# Patient Record
Sex: Male | Born: 1950
Health system: Southern US, Community
[De-identification: ages and names within clinical notes are randomized; demographics above are authoritative.]

## PROBLEM LIST (undated history)

## (undated) DIAGNOSIS — N189 Chronic kidney disease, unspecified: Secondary | ICD-10-CM

## (undated) DIAGNOSIS — Z8619 Personal history of other infectious and parasitic diseases: Secondary | ICD-10-CM

## (undated) DIAGNOSIS — E119 Type 2 diabetes mellitus without complications: Secondary | ICD-10-CM

## (undated) DIAGNOSIS — I10 Essential (primary) hypertension: Secondary | ICD-10-CM

## (undated) HISTORY — DX: Type 2 diabetes mellitus without complications: E11.9

## (undated) HISTORY — DX: Personal history of other infectious and parasitic diseases: Z86.19

---

## 1988-04-01 HISTORY — PX: KIDNEY STONE SURGERY: SHX686

## 2011-04-17 ENCOUNTER — Encounter (HOSPITAL_COMMUNITY): Payer: Self-pay | Admitting: Pharmacy Technician

## 2011-04-19 ENCOUNTER — Other Ambulatory Visit: Payer: Self-pay

## 2011-04-19 ENCOUNTER — Ambulatory Visit (HOSPITAL_COMMUNITY)
Admission: RE | Admit: 2011-04-19 | Discharge: 2011-04-19 | Disposition: A | Discharge status: Home / Self Care | Payer: 59 | Source: Ambulatory Visit | Attending: Anesthesiology | Admitting: Anesthesiology

## 2011-04-19 ENCOUNTER — Encounter (HOSPITAL_COMMUNITY)
Admission: RE | Admit: 2011-04-19 | Discharge: 2011-04-19 | Disposition: A | Discharge status: Home / Self Care | Payer: 59 | Source: Ambulatory Visit | Attending: Neurosurgery | Admitting: Neurosurgery

## 2011-04-19 ENCOUNTER — Encounter (HOSPITAL_COMMUNITY): Payer: Self-pay

## 2011-04-19 DIAGNOSIS — Z01818 Encounter for other preprocedural examination: Secondary | ICD-10-CM | POA: Insufficient documentation

## 2011-04-19 DIAGNOSIS — M4120 Other idiopathic scoliosis, site unspecified: Secondary | ICD-10-CM | POA: Insufficient documentation

## 2011-04-19 DIAGNOSIS — Z01810 Encounter for preprocedural cardiovascular examination: Secondary | ICD-10-CM | POA: Insufficient documentation

## 2011-04-19 DIAGNOSIS — Z01812 Encounter for preprocedural laboratory examination: Secondary | ICD-10-CM | POA: Insufficient documentation

## 2011-04-19 HISTORY — DX: Essential (primary) hypertension: I10

## 2011-04-19 HISTORY — DX: Chronic kidney disease, unspecified: N18.9

## 2011-04-19 LAB — CBC
Hemoglobin: 15.8 g/dL (ref 13.0–17.0)
Platelets: 235 10*3/uL (ref 150–400)
RBC: 5.32 MIL/uL (ref 4.22–5.81)
WBC: 7.4 10*3/uL (ref 4.0–10.5)

## 2011-04-19 LAB — BASIC METABOLIC PANEL
CO2: 26 mEq/L (ref 19–32)
Calcium: 9.7 mg/dL (ref 8.4–10.5)
GFR calc non Af Amer: >90 mL/min (ref >90)
Potassium: 4.3 mEq/L (ref 3.5–5.1)
Sodium: 140 mEq/L (ref 135–145)

## 2011-04-19 LAB — TYPE AND SCREEN: ABO/RH(D): O POS

## 2011-04-19 LAB — SURGICAL PCR SCREEN: Staphylococcus aureus: NEGATIVE

## 2011-04-19 LAB — ABO/RH: ABO/RH(D): O POS

## 2011-04-19 NOTE — Pre-Procedure Instructions (Signed)
20 CANTRELL LAROUCHE  04/19/2011   Your procedure is scheduled on:  Apr 26, 2011 (Friday)  Report to [facility] at 0530 AM.  Call this number if you have problems the morning of surgery: [phone]   Remember:   Do not eat food:After Midnight.  May have clear liquids: up to 4 Hours before arrival.  Clear liquids include soda, tea, black coffee, apple or grape juice, broth.  Take these medicines the morning of surgery with A SIP OF WATER: None   Do not wear jewelry, make-up or nail polish.  Do not wear lotions, powders, or perfumes. You may wear deodorant.  Do not shave 48 hours prior to surgery.  Do not bring valuables to the hospital.  Contacts, dentures or bridgework may not be worn into surgery.  Leave suitcase in the car. After surgery it may be brought to your room.  For patients admitted to the hospital, checkout time is 11:00 AM the day of discharge.   Patients discharged the day of surgery will not be allowed to drive home.  Name and phone number of your driver: NA  Special Instructions: CHG Shower Use Special Wash: 1/2 bottle night before surgery and 1/2 bottle morning of surgery.   Please read over the following fact sheets that you were given: Pain Booklet, Blood Transfusion Information and Surgical Site Infection Prevention

## 2011-04-25 MED ORDER — DEXAMETHASONE SODIUM PHOSPHATE 10 MG/ML IJ SOLN
10.0000 mg | Freq: Once | INTRAMUSCULAR | Status: AC
  Administered 2011-04-26: 10 mg via INTRAVENOUS
  Filled 2011-04-25: qty 1

## 2011-04-25 MED ORDER — SODIUM CHLORIDE 0.9 % IV SOLN
500.0000 mg | INTRAVENOUS | Status: AC
  Administered 2011-04-26: 1000 mg via INTRAVENOUS
  Filled 2011-04-25: qty 500

## 2011-04-26 ENCOUNTER — Encounter (HOSPITAL_COMMUNITY): Payer: Self-pay | Admitting: *Deleted

## 2011-04-26 ENCOUNTER — Encounter (HOSPITAL_COMMUNITY): Payer: Self-pay | Admitting: Anesthesiology

## 2011-04-26 ENCOUNTER — Ambulatory Visit (HOSPITAL_COMMUNITY)
Admission: RE | Admit: 2011-04-26 | Discharge: 2011-04-27 | Disposition: A | Discharge status: Home / Self Care | Payer: 59 | Source: Ambulatory Visit | Attending: Neurosurgery | Admitting: Neurosurgery

## 2011-04-26 ENCOUNTER — Encounter (HOSPITAL_COMMUNITY)
Admission: RE | Disposition: A | Discharge status: Home / Self Care | Payer: Self-pay | Source: Ambulatory Visit | Attending: Neurosurgery

## 2011-04-26 ENCOUNTER — Ambulatory Visit (HOSPITAL_COMMUNITY): Payer: 59

## 2011-04-26 ENCOUNTER — Ambulatory Visit (HOSPITAL_COMMUNITY): Payer: 59 | Admitting: Anesthesiology

## 2011-04-26 DIAGNOSIS — M47812 Spondylosis without myelopathy or radiculopathy, cervical region: Secondary | ICD-10-CM | POA: Insufficient documentation

## 2011-04-26 DIAGNOSIS — M5020 Other cervical disc displacement, unspecified cervical region: Secondary | ICD-10-CM | POA: Insufficient documentation

## 2011-04-26 DIAGNOSIS — I10 Essential (primary) hypertension: Secondary | ICD-10-CM | POA: Insufficient documentation

## 2011-04-26 HISTORY — PX: POSTERIOR CERVICAL FUSION/FORAMINOTOMY: SHX5038

## 2011-04-26 SURGERY — POSTERIOR CERVICAL FUSION/FORAMINOTOMY LEVEL 3
Anesthesia: General | Site: Neck | Laterality: Bilateral | Wound class: Clean

## 2011-04-26 MED ORDER — SODIUM CHLORIDE 0.9 % IJ SOLN: 3.0000 mL | Freq: Two times a day (BID) | INTRAMUSCULAR | Status: DC

## 2011-04-26 MED ORDER — HETASTARCH-ELECTROLYTES 6 % IV SOLN
INTRAVENOUS | Status: DC | PRN
  Administered 2011-04-26: 09:00:00 via INTRAVENOUS

## 2011-04-26 MED ORDER — HYDROMORPHONE HCL PF 1 MG/ML IJ SOLN: 0.5000 mg | INTRAMUSCULAR | Status: DC | PRN

## 2011-04-26 MED ORDER — ACETAMINOPHEN 325 MG PO TABS: 650.0000 mg | ORAL_TABLET | ORAL | Status: DC | PRN

## 2011-04-26 MED ORDER — ROCURONIUM BROMIDE 100 MG/10ML IV SOLN
INTRAVENOUS | Status: DC | PRN
  Administered 2011-04-26: 50 mg via INTRAVENOUS
  Administered 2011-04-26: 30 mg via INTRAVENOUS
  Administered 2011-04-26: 20 mg via INTRAVENOUS

## 2011-04-26 MED ORDER — PROMETHAZINE HCL 25 MG/ML IJ SOLN: 6.2500 mg | INTRAMUSCULAR | Status: DC | PRN

## 2011-04-26 MED ORDER — LIDOCAINE-EPINEPHRINE 1 %-1:100000 IJ SOLN
INTRAMUSCULAR | Status: DC | PRN
  Administered 2011-04-26: 10 mL

## 2011-04-26 MED ORDER — HYDROMORPHONE HCL PF 1 MG/ML IJ SOLN
0.2500 mg | INTRAMUSCULAR | Status: DC | PRN
  Administered 2011-04-26: 0.25 mg via INTRAVENOUS

## 2011-04-26 MED ORDER — SUFENTANIL CITRATE 50 MCG/ML IV SOLN
INTRAVENOUS | Status: DC | PRN
  Administered 2011-04-26 (×2): 10 ug via INTRAVENOUS
  Administered 2011-04-26: 40 ug via INTRAVENOUS

## 2011-04-26 MED ORDER — PROPOFOL 10 MG/ML IV EMUL
INTRAVENOUS | Status: DC | PRN
  Administered 2011-04-26: 200 mg via INTRAVENOUS

## 2011-04-26 MED ORDER — SODIUM CHLORIDE 0.9 % IR SOLN
Status: DC | PRN
  Administered 2011-04-26: 08:00:00

## 2011-04-26 MED ORDER — GLYCOPYRROLATE 0.2 MG/ML IJ SOLN
INTRAMUSCULAR | Status: DC | PRN
  Administered 2011-04-26: .7 mg via INTRAVENOUS

## 2011-04-26 MED ORDER — ACETAMINOPHEN 650 MG RE SUPP: 650.0000 mg | RECTAL | Status: DC | PRN

## 2011-04-26 MED ORDER — ONDANSETRON HCL 4 MG/2ML IJ SOLN: 4.0000 mg | INTRAMUSCULAR | Status: DC | PRN

## 2011-04-26 MED ORDER — OXYCODONE-ACETAMINOPHEN 5-325 MG PO TABS
1.0000 | ORAL_TABLET | ORAL | Status: DC | PRN
  Administered 2011-04-26 – 2011-04-27 (×2): 2 via ORAL
  Filled 2011-04-26 (×2): qty 2

## 2011-04-26 MED ORDER — MENTHOL 3 MG MT LOZG: 1.0000 | LOZENGE | OROMUCOSAL | Status: DC | PRN

## 2011-04-26 MED ORDER — 0.9 % SODIUM CHLORIDE (POUR BTL) OPTIME
TOPICAL | Status: DC | PRN
  Administered 2011-04-26: 1000 mL

## 2011-04-26 MED ORDER — PHENOL 1.4 % MT LIQD: 1.0000 | OROMUCOSAL | Status: DC | PRN

## 2011-04-26 MED ORDER — THROMBIN 5000 UNITS EX SOLR
OROMUCOSAL | Status: DC | PRN
  Administered 2011-04-26: 10:00:00 via TOPICAL

## 2011-04-26 MED ORDER — LACTATED RINGERS IV SOLN
INTRAVENOUS | Status: DC | PRN
  Administered 2011-04-26: 07:00:00 via INTRAVENOUS

## 2011-04-26 MED ORDER — BACITRACIN 50000 UNITS IM SOLR
INTRAMUSCULAR | Status: AC
  Filled 2011-04-26: qty 1

## 2011-04-26 MED ORDER — THROMBIN 20000 UNITS EX KIT: PACK | CUTANEOUS | Status: DC | PRN

## 2011-04-26 MED ORDER — NEOSTIGMINE METHYLSULFATE 1 MG/ML IJ SOLN
INTRAMUSCULAR | Status: DC | PRN
  Administered 2011-04-26: 5 mg via INTRAVENOUS

## 2011-04-26 MED ORDER — HYDROMORPHONE HCL PF 1 MG/ML IJ SOLN
INTRAMUSCULAR | Status: AC
  Filled 2011-04-26: qty 1

## 2011-04-26 MED ORDER — CYCLOBENZAPRINE HCL 10 MG PO TABS
10.0000 mg | ORAL_TABLET | Freq: Three times a day (TID) | ORAL | Status: DC | PRN
  Administered 2011-04-26 – 2011-04-27 (×2): 10 mg via ORAL
  Filled 2011-04-26 (×2): qty 1

## 2011-04-26 MED ORDER — PHENYLEPHRINE HCL 10 MG/ML IJ SOLN
INTRAMUSCULAR | Status: DC | PRN
  Administered 2011-04-26 (×3): 40 ug via INTRAVENOUS

## 2011-04-26 MED ORDER — SODIUM CHLORIDE 0.9 % IV SOLN
INTRAVENOUS | Status: AC
  Filled 2011-04-26: qty 500

## 2011-04-26 MED ORDER — OLMESARTAN 10 MG HALF TABLET
10.0000 mg | ORAL_TABLET | Freq: Every day | ORAL | Status: DC
  Administered 2011-04-26: 10 mg via ORAL
  Filled 2011-04-26 (×2): qty 1

## 2011-04-26 MED ORDER — BUPIVACAINE HCL (PF) 0.25 % IJ SOLN
INTRAMUSCULAR | Status: DC | PRN
  Administered 2011-04-26: 10 mL

## 2011-04-26 MED ORDER — LIDOCAINE HCL (CARDIAC) 20 MG/ML IV SOLN
INTRAVENOUS | Status: DC | PRN
  Administered 2011-04-26: 40 mg via INTRAVENOUS

## 2011-04-26 MED ORDER — MIDAZOLAM HCL 5 MG/5ML IJ SOLN
INTRAMUSCULAR | Status: DC | PRN
  Administered 2011-04-26: 2 mg via INTRAVENOUS

## 2011-04-26 MED ORDER — ONDANSETRON HCL 4 MG/2ML IJ SOLN
INTRAMUSCULAR | Status: DC | PRN
  Administered 2011-04-26: 4 mg via INTRAVENOUS

## 2011-04-26 MED ORDER — MEPERIDINE HCL 25 MG/ML IJ SOLN: 6.2500 mg | INTRAMUSCULAR | Status: DC | PRN

## 2011-04-26 MED ORDER — CEFAZOLIN SODIUM 1-5 GM-% IV SOLN
1.0000 g | Freq: Three times a day (TID) | INTRAVENOUS | Status: AC
  Administered 2011-04-26 – 2011-04-27 (×2): 1 g via INTRAVENOUS
  Filled 2011-04-26 (×2): qty 50

## 2011-04-26 SURGICAL SUPPLY — 61 items
BAG DECANTER FOR FLEXI CONT (MISCELLANEOUS) ×2 IMPLANT
BENZOIN TINCTURE PRP APPL 2/3 (GAUZE/BANDAGES/DRESSINGS) ×2 IMPLANT
BLADE SURG 11 STRL SS (BLADE) ×2 IMPLANT
BLADE SURG ROTATE 9660 (MISCELLANEOUS) IMPLANT
BUR MATCHSTICK NEURO 3.0 LAGG (BURR) ×2 IMPLANT
CANISTER SUCTION 2500CC (MISCELLANEOUS) ×2 IMPLANT
CLOTH BEACON ORANGE TIMEOUT ST (SAFETY) ×2 IMPLANT
CONT SPEC 4OZ CLIKSEAL STRL BL (MISCELLANEOUS) ×2 IMPLANT
DECANTER SPIKE VIAL GLASS SM (MISCELLANEOUS) ×2 IMPLANT
DERMABOND ADVANCED (GAUZE/BANDAGES/DRESSINGS) ×1
DERMABOND ADVANCED .7 DNX12 (GAUZE/BANDAGES/DRESSINGS) ×1 IMPLANT
DRAPE C-ARM 42X72 X-RAY (DRAPES) ×4 IMPLANT
DRAPE LAPAROTOMY 100X72 PEDS (DRAPES) ×2 IMPLANT
DRAPE MICROSCOPE LEICA (MISCELLANEOUS) ×2 IMPLANT
DRAPE MICROSCOPE ZEISS OPMI (DRAPES) IMPLANT
DRAPE POUCH INSTRU U-SHP 10X18 (DRAPES) ×2 IMPLANT
DRAPE SURG 17X23 STRL (DRAPES) IMPLANT
DRSG OPSITE 4X5.5 SM (GAUZE/BANDAGES/DRESSINGS) ×2 IMPLANT
ELECT REM PT RETURN 9FT ADLT (ELECTROSURGICAL) ×2
ELECTRODE REM PT RTRN 9FT ADLT (ELECTROSURGICAL) ×1 IMPLANT
EVACUATOR 1/8 PVC DRAIN (DRAIN) ×2 IMPLANT
GAUZE SPONGE 4X4 12PLY STRL LF (GAUZE/BANDAGES/DRESSINGS) ×2 IMPLANT
GAUZE SPONGE 4X4 16PLY XRAY LF (GAUZE/BANDAGES/DRESSINGS) IMPLANT
GLOVE BIO SURGEON STRL SZ8 (GLOVE) ×4 IMPLANT
GLOVE BIOGEL PI IND STRL 6.5 (GLOVE) ×1 IMPLANT
GLOVE BIOGEL PI INDICATOR 6.5 (GLOVE) ×1
GLOVE EXAM NITRILE LRG STRL (GLOVE) IMPLANT
GLOVE EXAM NITRILE MD LF STRL (GLOVE) IMPLANT
GLOVE EXAM NITRILE XL STR (GLOVE) IMPLANT
GLOVE EXAM NITRILE XS STR PU (GLOVE) IMPLANT
GLOVE INDICATOR 8.5 STRL (GLOVE) ×2 IMPLANT
GLOVE SURG SS PI 6.5 STRL IVOR (GLOVE) ×6 IMPLANT
GOWN BRE IMP SLV AUR LG STRL (GOWN DISPOSABLE) ×2 IMPLANT
GOWN BRE IMP SLV AUR XL STRL (GOWN DISPOSABLE) ×4 IMPLANT
GOWN STRL REIN 2XL LVL4 (GOWN DISPOSABLE) IMPLANT
HEMOSTAT POWDER SURGIFOAM 1G (HEMOSTASIS) ×2 IMPLANT
KIT BASIN OR (CUSTOM PROCEDURE TRAY) ×2 IMPLANT
KIT ROOM TURNOVER OR (KITS) ×2 IMPLANT
MARKER SKIN DUAL TIP RULER LAB (MISCELLANEOUS) ×2 IMPLANT
NEEDLE HYPO 25X1 1.5 SAFETY (NEEDLE) ×2 IMPLANT
NEEDLE SPNL 20GX3.5 QUINCKE YW (NEEDLE) ×2 IMPLANT
NS IRRIG 1000ML POUR BTL (IV SOLUTION) ×2 IMPLANT
PACK LAMINECTOMY NEURO (CUSTOM PROCEDURE TRAY) ×2 IMPLANT
PAD ARMBOARD 7.5X6 YLW CONV (MISCELLANEOUS) ×6 IMPLANT
PATTIES SURGICAL .5 X.5 (GAUZE/BANDAGES/DRESSINGS) ×2 IMPLANT
PIN MAYFIELD SKULL DISP (PIN) ×2 IMPLANT
RUBBERBAND STERILE (MISCELLANEOUS) ×4 IMPLANT
SPONGE GAUZE 4X4 12PLY (GAUZE/BANDAGES/DRESSINGS) ×2 IMPLANT
SPONGE LAP 4X18 X RAY DECT (DISPOSABLE) IMPLANT
SPONGE SURGIFOAM ABS GEL 100 (HEMOSTASIS) ×2 IMPLANT
STRIP CLOSURE SKIN 1/2X4 (GAUZE/BANDAGES/DRESSINGS) ×2 IMPLANT
SUT ETHILON 4 0 PS 2 18 (SUTURE) IMPLANT
SUT VIC AB 0 CT1 18XCR BRD8 (SUTURE) ×1 IMPLANT
SUT VIC AB 0 CT1 8-18 (SUTURE) ×1
SUT VIC AB 2-0 CT1 18 (SUTURE) ×2 IMPLANT
SUT VICRYL 4-0 PS2 18IN ABS (SUTURE) ×2 IMPLANT
SYR 20ML ECCENTRIC (SYRINGE) ×2 IMPLANT
TOWEL OR 17X24 6PK STRL BLUE (TOWEL DISPOSABLE) ×2 IMPLANT
TOWEL OR 17X26 10 PK STRL BLUE (TOWEL DISPOSABLE) ×2 IMPLANT
TRAY FOLEY CATH 14FRSI W/METER (CATHETERS) ×2 IMPLANT
WATER STERILE IRR 1000ML POUR (IV SOLUTION) ×2 IMPLANT

## 2011-04-26 NOTE — Plan of Care (Signed)
Problem: Consults Goal: Diagnosis - Spinal Surgery Outcome: Completed/Met Date Met:  04/26/11 Cervical Spine Fusion     

## 2011-04-26 NOTE — Anesthesia Postprocedure Evaluation (Signed)
  Anesthesia Post-op Note  Patient: Devon Payne  Procedure(s) Performed:  POSTERIOR CERVICAL FUSION/FORAMINOTOMY LEVEL 3 - Right Cervical five-six,, Left Cervical six-seven, Right Cervical seven thoracic one Posterior Cervical Foraminotomy/ Diskectomy 3hrs Rm 33 (to follow)  Patient Location: PACU  Anesthesia Type: General  Level of Consciousness: awake  Airway and Oxygen Therapy: Patient Spontanous Breathing  Post-op Pain: mild  Post-op Assessment: Post-op Vital signs reviewed  Post-op Vital Signs: stable  Complications: No apparent anesthesia complications

## 2011-04-26 NOTE — Transfer of Care (Signed)
Immediate Anesthesia Transfer of Care Note  Patient: CHACE KLIPPEL  Procedure(s) Performed:  POSTERIOR CERVICAL FUSION/FORAMINOTOMY LEVEL 3 - Right Cervical five-six,, Left Cervical six-seven, Right Cervical seven thoracic one Posterior Cervical Foraminotomy/ Diskectomy 3hrs Rm 33 (to follow)  Patient Location: PACU  Anesthesia Type: General  Level of Consciousness: sedated  Airway & Oxygen Therapy: Patient Spontanous Breathing and Patient connected to nasal cannula oxygen  Post-op Assessment: Report given to PACU RN and Post -op Vital signs reviewed and stable  Post vital signs: Reviewed and stable  Complications: No apparent anesthesia complications

## 2011-04-26 NOTE — H&P (Signed)
Devon Payne is an 61 y.o. male.   Chief Complaint: Neck and right greater than left arm pain HPI: Patient 61 year old gentleman is a progressive worsening neck and pain in both arms with weakness in his right hand and loss of grip strength. He reports pain that goes down the left arm and first fingers of his left and consistent with a C7 nerve root pattern and the pain his right hand is into his thumb it also affects his hand and intrinsics. Several weeks and months as a refractory to all forms of conservative treatment. Clinical exam and presentation was consistent with ruptured weakness MRI scan showed significant foraminal stenosis C6 and C8 roots on the right and C7 her a lot and it is very conservative treatment progression of clinical syndrome MRI findings patient was recommended posterior cervical foraminotomies at these levels.  Past Medical History  Diagnosis Date  . Chronic kidney disease     kidney stones  . Hypertension     Past Surgical History  Procedure Date  . Kidney stone surgery 1990    History reviewed. No pertinent family history. Social History:  reports that he has never smoked. He does not have any smokeless tobacco history on file. He reports that he does not drink alcohol or use illicit drugs.  Allergies:  Allergies  Allergen Reactions  . Amoxicillin Rash    Medications Prior to Admission  Medication Dose Route Frequency Provider Last Rate Last Dose  . dexamethasone (DECADRON) injection 10 mg  10 mg Intravenous Once Devon Dollar, MD      . vancomycin (VANCOCIN) 500 mg in sodium chloride 0.9 % 100 mL IVPB  500 mg Intravenous 60 min Pre-Op Devon Dollar, MD       Medications Prior to Admission  Medication Sig Dispense Refill  . valsartan (DIOVAN) 160 MG tablet Take 160 mg by mouth daily.        No results found for this or any previous visit (from the past 48 hour(s)). No results found.  Review of Systems  Constitutional: Negative.   HENT: Positive for  neck pain.   Eyes: Negative.   Respiratory: Negative.   Cardiovascular: Negative.   Gastrointestinal: Negative.   Musculoskeletal: Positive for myalgias.    Blood pressure 159/97, pulse 82, temperature 98.3 F (36.8 C), temperature source Oral, resp. rate 18, SpO2 96.00%. Physical Exam  Constitutional: He is oriented to person, place, and time. He appears well-developed and well-nourished.  HENT:  Head: Normocephalic and atraumatic.  Eyes: Pupils are equal, round, and reactive to light.  Cardiovascular: Normal rate.   Respiratory: Breath sounds normal.  GI: Soft.  Neurological: He is alert and oriented to person, place, and time. He has normal strength. GCS eye subscore is 4. GCS verbal subscore is 5. GCS motor subscore is 6.  Reflex Scores:      Tricep reflexes are 0 on the right side and 0 on the left side.      Bicep reflexes are 1+ on the right side and 1+ on the left side.      Brachioradialis reflexes are 1+ on the right side and 1+ on the left side.      Patellar reflexes are 1+ on the right side and 1+ on the left side.      Achilles reflexes are 0 on the right side and 0 on the left side.      Patient has weakness of his right hand intrinsics at about 4-4+ out of  5 otherwise she is 5 out of 5 in his upper extremities.     Assessment/Plan 61 year old gentleman who presents for posterior cervical foraminotomies at C5-6 and C7-T1 the right and C6-7 left. I extensively gone over the risks and benefits of surgery, alternatives to surgery, expectations of outcome, and perioperative course. He understands and agrees to proceed forward.  Devon Payne,Devon Payne 04/26/2011, 7:07 AM

## 2011-04-26 NOTE — Anesthesia Preprocedure Evaluation (Addendum)
Anesthesia Evaluation  Patient identified by MRN, date of birth, ID band Patient awake    Reviewed: Allergy & Precautions, H&P , NPO status , Patient's Chart, lab work & pertinent test results  Airway Mallampati: II TM Distance: >3 FB Neck ROM: Full    Dental  (+) Teeth Intact and Dental Advisory Given   Pulmonary neg pulmonary ROS,  clear to auscultation        Cardiovascular hypertension, Regular Normal    Neuro/Psych Negative Neurological ROS  Negative Psych ROS   GI/Hepatic negative GI ROS, Neg liver ROS,   Endo/Other  Negative Endocrine ROS  Renal/GU negative Renal ROS  Genitourinary negative   Musculoskeletal negative musculoskeletal ROS (+)   Abdominal (+) obese,   Peds negative pediatric ROS (+)  Hematology negative hematology ROS (+)   Anesthesia Other Findings   Reproductive/Obstetrics negative OB ROS                          Anesthesia Physical Anesthesia Plan  ASA: II  Anesthesia Plan: General   Post-op Pain Management:    Induction: Intravenous  Airway Management Planned: Oral ETT  Additional Equipment:   Intra-op Plan:   Post-operative Plan: Extubation in OR  Informed Consent: I have reviewed the patients History and Physical, chart, labs and discussed the procedure including the risks, benefits and alternatives for the proposed anesthesia with the patient or authorized representative who has indicated his/her understanding and acceptance.     Plan Discussed with: CRNA and Surgeon  Anesthesia Plan Comments:         Anesthesia Quick Evaluation

## 2011-04-26 NOTE — Op Note (Signed)
Preoperative diagnosis: Cervical spondylosis C5-6 right C6-7 left and herniated nucleus pulposus C7-T1 right with right-sided C6 and C8 radiculopathy and left-sided C7 radiculopathy  Postoperative diagnosis: Same  Procedure: Posterior cervical foraminotomies at C5-6 on the right, C6-7 on the left, and C7-T1 on the right with microscopic discectomy at at C7-T1 on the right and microscopic foraminotomies of the right C6 left C7 and right C8 nerve roots  Surgeon: Jillyn Hidden Ellarose Brandi  Assistant: Marikay Alar  Anesthesia: Gen.  EBL: Less than 100  History of present illness: Patient is 61 year old and has had neck and bilateral arm pain with weakness of his right hand this got progressively worse over last several weeks and months the exam revealed significant hand intrinsic weakness and trace tricep weakness bilaterally MRI scan showed cervical spondylosis at C5-6 C6 to and C7-T1 with a large herniated nucleus pulposus and the foramen at C7-T1 the right and severe foraminal stenosis at C5-6 on the right and C6-7 with good this take surgery meeting findings and progression of clinical syndrome patient was recommended foraminotomies of the right C6 and left C7 nerve root and discectomy and foraminotomy of the right C8 nerve root the due to the anatomy at an inability to achieve exposure to C7-T1 anteriorly all these recommended he come posteriorly. The risks benefits of the operation, perioperative course, alternatives to surgery, and expectations of outcome were all spine the patient he understands and agreed to proceed forward.  Operative procedure: Patient was brought into the or was induced under general anesthesia and positioned prone in pins the neck in slight flexion. The C6 and C7 spinous processes were palpated and after infiltration of 10 cc lidocaine epi a midline incision was made and but will occur was used taken the subcutaneous tissues and subperiosteal dissections care on the lamina of C5-6-7 and T1  bilaterally interoperative x-ray identified the appropriate level. Then foraminotomies were begun first at C5-6 on the right C6-7 on the left and subsequent C7-T1 RN was begun by initiating 012 mm in punch  The impressive lamina process of the lamina below was bitten away as well as and by a medial facet complex partial medial facet complexes and drill down a high-speed drill at all 3 levels the ligament was identified and removed in piecemeal fashion there was inspected the proximal takeoff of the respective roots were then visualized. At this point the operating microscope was draped and brought into the field under microscopic illumination first working at C6-7 on the left C7 pedicle was identified and the C7 nerve root was identified and then under biting the foramen a ladder unroofing of the foramen and C7 nerve root significantly out the foramen the disc was decompressed flush with pedicle and the space was inspected and there is no disc herniation appreciated there is marked spondylosis and bone spur coming off the facet complex displacement dorsal aspect the C7 nerve root this is all been decompressed and severed. The foraminotomy was easily passing I nerve hook and a 7 room dissector out the foramen. This is intact Gelfoam attention taken to C7-T1 on the right, again the the T1 pedicle was identified in the proximal takeoff the C8 root was identified and immediately visualize a large disc herniation immediately under the C8 nerve root medially pedicle. The annulotomy was made with 11 blade scalpel and using a blunt nerve hook several large free from stitch or merely removed from the foramen. The end of the discectomy there is no further stenosis and C8 root although  the foramen was partially unroofed to further ensure decompression the see extremities except a Rhoton dissector I nerve hook after the foraminotomies after discectomy. Another expiration around the disc space confirm no additional fragments  were appreciated this is packed with Gelfoam and a second C5-6 on the right. Working on the C6 pedicle the C6 nerve root was identified and the foramen was unroofed following C6 nerve root out the foramen. The spaces inspected and there is no disc fragments appreciate an marked spondylosis is causing significant foraminal stenosis the C8 root this is all aggressively under been with a 1 Kerrison punch decompressing the foramen the foraminotomy the symphysis and C6 nerve root. It was in to see her get meticulous hemostasis was maintained using Gelfoam and Surgifoam a medium Hemovac drain was placed and the wounds closed in layers with interrupted Vicryl and the skin was closed with a running 4 subcuticular. At the end of the case the wound is dressed and patient went was sent to the recovery room in stable condition. At the end of case all needle counts and sponge counts were correct.

## 2011-04-27 MED ORDER — OXYCODONE-ACETAMINOPHEN 5-325 MG PO TABS: 1.0000 | ORAL_TABLET | ORAL | Status: AC | PRN

## 2011-04-27 NOTE — Discharge Summary (Signed)
  Physician Discharge Summary  Patient ID: Devon Payne MRN: [id number] DOB/AGE: 05-21-1950 61 y.o.  Admit date: 04/26/2011 Discharge date: 04/27/2011  Admission Diagnoses:  Discharge Diagnoses:  Active Problems:  * No active hospital problems. *    Discharged Condition: good  Hospital Course: Surgery one day, home the next. Did well. Pain much better. Wound healing well  Consults: None  Significant Diagnostic Studies: none  Treatments: surgery: 3 level cervical foraminotomy  Discharge Exam: Blood pressure 113/70, pulse 78, temperature 97.4 F (36.3 C), temperature source Oral, resp. rate 18, SpO2 93.00%. Incision/Wound:healing well  Disposition: Final discharge disposition not confirmed  Discharge Orders    Future Orders Please Complete By Expires   Diet general      Discharge instructions      Comments:   Mostly bedrest. Get up 9 or 10 times each day and walk for 15-20 minutes each time. Very little sitting the first week. No riding in the car until your first post op appointment. If you had neck surgery...may shower from the chest down. If you had low back surgery....you may shower with a saran wrap covering over the incision. Take your pain medicine as needed...and other medicines that you are instructed to take. Call for an appointment...[phone].   Call MD for:  temperature >100.4      Call MD for:  persistant nausea and vomiting      Call MD for:  severe uncontrolled pain      Call MD for:  redness, tenderness, or signs of infection (pain, swelling, redness, odor or green/yellow discharge around incision site)      Call MD for:  difficulty breathing, headache or visual disturbances      Call MD for:  hives        Medication List  As of 04/27/2011 10:44 AM   TAKE these medications         oxyCODONE-acetaminophen 5-325 MG per tablet   Commonly known as: PERCOCET   Take 1-2 tablets by mouth every 4 (four) hours as needed.      valsartan 160 MG tablet   Commonly  known as: DIOVAN   Take 160 mg by mouth daily.             At home rest most of the time. Get up 9 or 10 times each day and take a 15 or 20 minute walk. No riding in the car and to your first postoperative appointment. If you have neck surgery you may shower from the chest down starting on the third postoperative day. If you had back surgery he may start showering on the third postoperative day with saran wrap wrapped around your incisional area 3 times. After the shower remove the saran wrap. Take pain medicine as needed and other medications as instructed. Call my office for an appointment.  SignedReinaldo Meeker, MD 04/27/2011, 10:44 AM

## 2011-04-29 ENCOUNTER — Encounter (HOSPITAL_COMMUNITY): Payer: Self-pay | Admitting: Neurosurgery

## 2011-04-30 NOTE — Progress Notes (Signed)
Utilization review completed. Yola Paradiso, RN, BSN. 04/30/11  

## 2013-09-16 LAB — HM COLONOSCOPY: HM COLON: NORMAL

## 2014-10-21 ENCOUNTER — Telehealth: Payer: Self-pay | Admitting: Internal Medicine

## 2014-10-21 NOTE — Telephone Encounter (Signed)
Pt called in and said that Devon Payne referred him to you.  He would like to see if you would take him on as a new pt?

## 2014-10-23 NOTE — Telephone Encounter (Signed)
yes

## 2014-11-02 ENCOUNTER — Ambulatory Visit (INDEPENDENT_AMBULATORY_CARE_PROVIDER_SITE_OTHER): Payer: Managed Care, Other (non HMO) | Admitting: Internal Medicine

## 2014-11-02 ENCOUNTER — Encounter: Payer: Self-pay | Admitting: Internal Medicine

## 2014-11-02 VITALS — BP 126/84 | HR 94 | Temp 98.1°F | Resp 16 | Ht 69.0 in | Wt 222.0 lb

## 2014-11-02 DIAGNOSIS — E785 Hyperlipidemia, unspecified: Secondary | ICD-10-CM | POA: Diagnosis not present

## 2014-11-02 DIAGNOSIS — I10 Essential (primary) hypertension: Secondary | ICD-10-CM | POA: Diagnosis not present

## 2014-11-02 DIAGNOSIS — E118 Type 2 diabetes mellitus with unspecified complications: Secondary | ICD-10-CM

## 2014-11-02 MED ORDER — ASPIRIN EC 81 MG PO TBEC: 81.0000 mg | DELAYED_RELEASE_TABLET | Freq: Every day | ORAL | Status: DC

## 2014-11-02 NOTE — Patient Instructions (Signed)

## 2014-11-02 NOTE — Progress Notes (Signed)
Pre visit review using our clinic review tool, if applicable. No additional management support is needed unless otherwise documented below in the visit note. 

## 2014-11-02 NOTE — Progress Notes (Signed)
Subjective:  Patient ID: Devon Payne, male    DOB: December 08, 1950  Age: 64 y.o. MRN: [id number]  CC: Diabetes; Hypertension; and Hyperlipidemia   HPI Devon Payne presents for establishment as a new patient. He was previously seeing a physician in [facility] who has left town. He recently had complete lab work and a physical within the last 1-2 months. He was recently diagnosed with diabetes with hemoglobin A1c of 6.9%. He is committed to treating this with lifestyle modifications. He offers no symptoms or complaints today.  Outpatient Prescriptions Prior to Visit  Medication Sig Dispense Refill  . valsartan (DIOVAN) 160 MG tablet Take 160 mg by mouth daily.     No facility-administered medications prior to visit.    ROS Review of Systems  Constitutional: Negative for fever, chills, diaphoresis, appetite change and fatigue.  HENT: Negative.   Eyes: Negative.   Respiratory: Negative.  Negative for cough, choking, chest tightness, shortness of breath and stridor.   Cardiovascular: Negative.  Negative for chest pain, palpitations and leg swelling.  Gastrointestinal: Negative.  Negative for nausea, vomiting, abdominal pain, diarrhea, constipation and blood in stool.  Endocrine: Negative.  Negative for polydipsia, polyphagia and polyuria.  Genitourinary: Negative.  Negative for difficulty urinating.  Musculoskeletal: Negative.   Skin: Negative.   Allergic/Immunologic: Negative.   Neurological: Negative.  Negative for dizziness, weakness and light-headedness.  Hematological: Negative.  Negative for adenopathy. Does not bruise/bleed easily.  Psychiatric/Behavioral: Negative.     Objective:  BP 126/84 mmHg  Pulse 94  Temp(Src) 98.1 F (36.7 C) (Oral)  Resp 16  Ht 5\' 9"  (1.753 m)  Wt 222 lb (100.699 kg)  BMI 32.77 kg/m2  SpO2 96%  BP Readings from Last 3 Encounters:  11/02/14 126/84  04/27/11 113/70  04/19/11 137/87    Wt Readings from Last 3 Encounters:  11/02/14 222 lb  (100.699 kg)  04/19/11 224 lb 6.9 oz (101.8 kg)    Physical Exam  Constitutional: He is oriented to person, place, and time. No distress.  HENT:  Mouth/Throat: Oropharynx is clear and moist. No oropharyngeal exudate.  Eyes: Conjunctivae are normal. Right eye exhibits no discharge. Left eye exhibits no discharge. No scleral icterus.  Neck: Normal range of motion. Neck supple. No JVD present. No tracheal deviation present. No thyromegaly present.  Cardiovascular: Normal rate, regular rhythm, normal heart sounds and intact distal pulses.  Exam reveals no gallop and no friction rub.   No murmur heard. Pulmonary/Chest: Effort normal and breath sounds normal. No stridor. No respiratory distress. He has no wheezes. He has no rales. He exhibits no tenderness.  Abdominal: Soft. Bowel sounds are normal. He exhibits no distension and no mass. There is no tenderness. There is no rebound and no guarding.  Musculoskeletal: Normal range of motion. He exhibits no edema or tenderness.  Lymphadenopathy:    He has no cervical adenopathy.  Neurological: He is oriented to person, place, and time.  Skin: Skin is warm and dry. No rash noted. He is not diaphoretic. No erythema. No pallor.  Psychiatric: He has a normal mood and affect. His behavior is normal. Judgment and thought content normal.  Vitals reviewed.   Lab Results  Component Value Date   WBC 7.4 04/19/2011   HGB 15.8 04/19/2011   HCT 44.8 04/19/2011   PLT 235 04/19/2011   GLUCOSE 116* 04/19/2011   NA 140 04/19/2011   K 4.3 04/19/2011   CL 103 04/19/2011   CREATININE 0.86 04/19/2011   BUN  17 04/19/2011   CO2 26 04/19/2011    Dg Chest 2 View  04/19/2011   *RADIOLOGY REPORT*  Clinical Data: Preop for cervical spine surgery  CHEST - 2 VIEW  Comparison: None.  Findings: Cardiomediastinal silhouette is unremarkable.  Mild thoracic dextroscoliosis.  Mild degenerative changes mid thoracic spine.  No acute infiltrate or pleural effusion.  No  pulmonary edema.  Minimal elevation of the right hemidiaphragm.  IMPRESSION: No active disease.  Mild degenerative changes thoracic spine.  Original Report Authenticated By: Lahoma Crocker, M.D.   Assessment & Plan:   Devon Payne was seen today for diabetes, hypertension and hyperlipidemia.  Diagnoses and all orders for this visit:  Type II diabetes mellitus with manifestations- he does not need medical therapy at this time, I will recheck his hemoglobin A1c in about 3-4 months Orders: -     aspirin EC 81 MG tablet; Take 1 tablet (81 mg total) by mouth daily.  Hyperlipidemia with target LDL less than 100- he is doing well on the statin  Essential hypertension- his blood pressure is well-controlled.   I have discontinued [name]'s valsartan. I am also having him start on aspirin EC. Additionally, I am having him maintain his atorvastatin and hydrochlorothiazide.  Meds ordered this encounter  Medications  . atorvastatin (LIPITOR) 40 MG tablet    Sig: Take 40 mg by mouth at bedtime.    Refill:  5  . hydrochlorothiazide (HYDRODIURIL) 25 MG tablet    Sig: Take 25 mg by mouth daily.    Refill:  3  . aspirin EC 81 MG tablet    Sig: Take 1 tablet (81 mg total) by mouth daily.    Dispense:  90 tablet    Refill:  3     Follow-up: Return in about 4 months (around 03/04/2015).  Scarlette Calico, MD

## 2014-11-03 ENCOUNTER — Encounter: Payer: Self-pay | Admitting: Internal Medicine

## 2015-05-10 ENCOUNTER — Encounter: Payer: Self-pay | Admitting: Internal Medicine

## 2015-05-10 ENCOUNTER — Ambulatory Visit (INDEPENDENT_AMBULATORY_CARE_PROVIDER_SITE_OTHER): Payer: Managed Care, Other (non HMO) | Admitting: Internal Medicine

## 2015-05-10 VITALS — BP 140/90 | HR 72 | Temp 97.1°F | Resp 16 | Ht 69.0 in | Wt 222.0 lb

## 2015-05-10 DIAGNOSIS — I10 Essential (primary) hypertension: Secondary | ICD-10-CM | POA: Diagnosis not present

## 2015-05-10 DIAGNOSIS — E785 Hyperlipidemia, unspecified: Secondary | ICD-10-CM | POA: Diagnosis not present

## 2015-05-10 DIAGNOSIS — E118 Type 2 diabetes mellitus with unspecified complications: Secondary | ICD-10-CM

## 2015-05-10 NOTE — Progress Notes (Signed)
Pre visit review using our clinic review tool, if applicable. No additional management support is needed unless otherwise documented below in the visit note. 

## 2015-05-10 NOTE — Progress Notes (Signed)
Subjective:  Patient ID: Devon Payne, male    DOB: June 05, 1950  Age: 65 y.o. MRN: [id number]  CC: Hyperlipidemia and Hypertension   HPI Devon Payne presents for follow-up on diabetes and hypertension. He complains of weight gain and has not been successful with his lifestyle modifications. He he complains that atorvastatin was causing some muscle aches so he stopped taking it about 2-3 weeks ago. Fortunately the muscle aches have resolved.  Outpatient Prescriptions Prior to Visit  Medication Sig Dispense Refill  . aspirin EC 81 MG tablet Take 1 tablet (81 mg total) by mouth daily. 90 tablet 3  . hydrochlorothiazide (HYDRODIURIL) 25 MG tablet Take 25 mg by mouth daily.  3  . atorvastatin (LIPITOR) 40 MG tablet Take 40 mg by mouth at bedtime.  5   No facility-administered medications prior to visit.    ROS Review of Systems  Constitutional: Positive for unexpected weight change. Negative for fever, chills, diaphoresis, appetite change and fatigue.  HENT: Negative.   Eyes: Negative.   Respiratory: Negative.  Negative for cough, choking, chest tightness, shortness of breath and stridor.   Cardiovascular: Negative.  Negative for chest pain, palpitations and leg swelling.  Gastrointestinal: Negative.  Negative for nausea, vomiting, abdominal pain, diarrhea, constipation and blood in stool.  Endocrine: Negative.  Negative for polydipsia, polyphagia and polyuria.  Genitourinary: Negative.  Negative for difficulty urinating.  Musculoskeletal: Negative.  Negative for myalgias, back pain, joint swelling, arthralgias and neck pain.  Skin: Negative.  Negative for color change and rash.  Allergic/Immunologic: Negative.   Neurological: Negative.  Negative for dizziness, weakness and light-headedness.  Hematological: Negative.  Negative for adenopathy. Does not bruise/bleed easily.  Psychiatric/Behavioral: Negative.     Objective:  BP 140/90 mmHg  Pulse 72  Temp(Src) 97.1 F (36.2 C)  (Oral)  Resp 16  Ht 5\' 9"  (1.753 m)  Wt 222 lb (100.699 kg)  BMI 32.77 kg/m2  SpO2 94%  BP Readings from Last 3 Encounters:  05/10/15 140/90  11/02/14 126/84  04/27/11 113/70    Wt Readings from Last 3 Encounters:  05/10/15 222 lb (100.699 kg)  11/02/14 222 lb (100.699 kg)  04/19/11 224 lb 6.9 oz (101.8 kg)    Physical Exam  Constitutional: He is oriented to person, place, and time. He appears well-developed and well-nourished. No distress.  HENT:  Mouth/Throat: Oropharynx is clear and moist. No oropharyngeal exudate.  Eyes: Conjunctivae are normal. Right eye exhibits no discharge. Left eye exhibits no discharge. No scleral icterus.  Neck: Normal range of motion. Neck supple. No JVD present. No tracheal deviation present. No thyromegaly present.  Cardiovascular: Normal rate, regular rhythm, normal heart sounds and intact distal pulses.  Exam reveals no gallop and no friction rub.   No murmur heard. Pulmonary/Chest: Effort normal and breath sounds normal. No stridor. No respiratory distress. He has no wheezes. He has no rales. He exhibits no tenderness.  Abdominal: Soft. Bowel sounds are normal. He exhibits no distension and no mass. There is no tenderness. There is no rebound and no guarding.  Musculoskeletal: Normal range of motion. He exhibits no edema or tenderness.  Lymphadenopathy:    He has no cervical adenopathy.  Neurological: He is oriented to person, place, and time.  Skin: Skin is warm and dry. No rash noted. He is not diaphoretic. No erythema. No pallor.  Psychiatric: He has a normal mood and affect. His behavior is normal. Judgment and thought content normal.  Vitals reviewed.   Lab Results  Component Value Date   WBC 7.8 05/11/2015   HGB 16.7 05/11/2015   HCT 49.7 05/11/2015   PLT 236.0 05/11/2015   GLUCOSE 156* 05/11/2015   CHOL 230* 05/11/2015   TRIG 227.0* 05/11/2015   HDL 48.20 05/11/2015   LDLDIRECT 141.0 05/11/2015   ALT 42 05/11/2015   AST 30  05/11/2015   NA 138 05/11/2015   K 4.2 05/11/2015   CL 100 05/11/2015   CREATININE 1.09 05/11/2015   BUN 17 05/11/2015   CO2 29 05/11/2015   TSH 1.73 05/11/2015   HGBA1C 7.4* 05/11/2015   MICROALBUR 1.8 05/11/2015    Dg Chest 2 View  04/19/2011  *RADIOLOGY REPORT* Clinical Data: Preop for cervical spine surgery CHEST - 2 VIEW Comparison: None. Findings: Cardiomediastinal silhouette is unremarkable.  Mild thoracic dextroscoliosis.  Mild degenerative changes mid thoracic spine.  No acute infiltrate or pleural effusion.  No pulmonary edema.  Minimal elevation of the right hemidiaphragm. IMPRESSION: No active disease.  Mild degenerative changes thoracic spine. Original Report Authenticated By: Lahoma Crocker, M.D.   Assessment & Plan:   Devon Payne was seen today for hyperlipidemia and hypertension.  Diagnoses and all orders for this visit:  Essential hypertension- his blood pressures well controlled, his lites and renal function are stable. Tinea the current therapy. -     CBC with Differential/Platelet; Future -     Comprehensive metabolic panel; Future -     Urinalysis, Routine w reflex microscopic (not at [facility]); Future  Type 2 diabetes mellitus with complication, without long-term current use of insulin ([place])- his A1c is up to 7.4%, he will work on his lifestyle modifications to lower his blood sugars and at that is not successful then I will start a medication to treat this. -     Lipid panel; Future -     Comprehensive metabolic panel; Future -     Hemoglobin A1c; Future -     Microalbumin / creatinine urine ratio; Future  Hyperlipidemia with target LDL less than 100- he has not achieved his LDL goal and is not currently taking a statin, he is recovering from side effects and is not willing to start another statin at this time, I will address this again in the future and will try a different statin at a later day. -     Lipid panel; Future -     Comprehensive metabolic panel; Future -      TSH; Future  Other orders -     Cancel: atorvastatin (LIPITOR) 40 MG tablet; Take 1 tablet (40 mg total) by mouth at bedtime.   I have discontinued Devon Payne's atorvastatin. I am also having him maintain his hydrochlorothiazide and aspirin EC.  No orders of the defined types were placed in this encounter.     Follow-up: Return in about 6 months (around 11/07/2015).  Scarlette Calico, MD

## 2015-05-10 NOTE — Patient Instructions (Signed)

## 2015-05-11 ENCOUNTER — Other Ambulatory Visit (INDEPENDENT_AMBULATORY_CARE_PROVIDER_SITE_OTHER): Payer: Managed Care, Other (non HMO)

## 2015-05-11 ENCOUNTER — Encounter: Payer: Self-pay | Admitting: Internal Medicine

## 2015-05-11 DIAGNOSIS — E118 Type 2 diabetes mellitus with unspecified complications: Secondary | ICD-10-CM

## 2015-05-11 DIAGNOSIS — E785 Hyperlipidemia, unspecified: Secondary | ICD-10-CM | POA: Diagnosis not present

## 2015-05-11 DIAGNOSIS — I10 Essential (primary) hypertension: Secondary | ICD-10-CM

## 2015-05-11 LAB — COMPREHENSIVE METABOLIC PANEL
ALK PHOS: 45 U/L (ref 39–117)
ALT: 42 U/L (ref 0–53)
AST: 30 U/L (ref 0–37)
Albumin: 4.5 g/dL (ref 3.5–5.2)
BUN: 17 mg/dL (ref 6–23)
CHLORIDE: 100 meq/L (ref 96–112)
CO2: 29 mEq/L (ref 19–32)
Calcium: 9.8 mg/dL (ref 8.4–10.5)
Creatinine, Ser: 1.09 mg/dL (ref 0.40–1.50)
GFR: 72.15 mL/min (ref >60.00)
Glucose, Bld: 156 mg/dL — ABNORMAL HIGH (ref 70–99)
POTASSIUM: 4.2 meq/L (ref 3.5–5.1)
SODIUM: 138 meq/L (ref 135–145)
Total Bilirubin: 0.9 mg/dL (ref 0.2–1.2)
Total Protein: 7.7 g/dL (ref 6.0–8.3)

## 2015-05-11 LAB — URINALYSIS, ROUTINE W REFLEX MICROSCOPIC
BILIRUBIN URINE: NEGATIVE
Ketones, ur: NEGATIVE
LEUKOCYTES UA: NEGATIVE
Nitrite: NEGATIVE
Specific Gravity, Urine: 1.025 (ref 1.000–1.030)
TOTAL PROTEIN, URINE-UPE24: NEGATIVE
Urine Glucose: NEGATIVE
Urobilinogen, UA: 0.2 (ref 0.0–1.0)
pH: 5.5 (ref 5.0–8.0)

## 2015-05-11 LAB — LIPID PANEL
CHOL/HDL RATIO: 5
Cholesterol: 230 mg/dL — ABNORMAL HIGH (ref 0–200)
HDL: 48.2 mg/dL (ref >39.00)
NONHDL: 181.79
TRIGLYCERIDES: 227 mg/dL — AB (ref 0.0–149.0)
VLDL: 45.4 mg/dL — ABNORMAL HIGH (ref 0.0–40.0)

## 2015-05-11 LAB — CBC WITH DIFFERENTIAL/PLATELET
BASOS PCT: 0.7 % (ref 0.0–3.0)
Basophils Absolute: 0.1 10*3/uL (ref 0.0–0.1)
EOS ABS: 0.1 10*3/uL (ref 0.0–0.7)
Eosinophils Relative: 1.4 % (ref 0.0–5.0)
HCT: 49.7 % (ref 39.0–52.0)
HEMOGLOBIN: 16.7 g/dL (ref 13.0–17.0)
Lymphocytes Relative: 30.5 % (ref 12.0–46.0)
Lymphs Abs: 2.4 10*3/uL (ref 0.7–4.0)
MCHC: 33.7 g/dL (ref 30.0–36.0)
MCV: 84.8 fl (ref 78.0–100.0)
MONO ABS: 0.8 10*3/uL (ref 0.1–1.0)
MONOS PCT: 10.5 % (ref 3.0–12.0)
Neutro Abs: 4.5 10*3/uL (ref 1.4–7.7)
Neutrophils Relative %: 56.9 % (ref 43.0–77.0)
PLATELETS: 236 10*3/uL (ref 150.0–400.0)
RBC: 5.86 Mil/uL — ABNORMAL HIGH (ref 4.22–5.81)
RDW: 14.3 % (ref 11.5–15.5)
WBC: 7.8 10*3/uL (ref 4.0–10.5)

## 2015-05-11 LAB — HEMOGLOBIN A1C: HEMOGLOBIN A1C: 7.4 % — AB (ref 4.6–6.5)

## 2015-05-11 LAB — LDL CHOLESTEROL, DIRECT: LDL DIRECT: 141 mg/dL

## 2015-05-11 LAB — TSH: TSH: 1.73 u[IU]/mL (ref 0.35–4.50)

## 2015-05-11 LAB — MICROALBUMIN / CREATININE URINE RATIO
CREATININE, U: 240.3 mg/dL
MICROALB UR: 1.8 mg/dL (ref 0.0–1.9)
MICROALB/CREAT RATIO: 0.7 mg/g (ref 0.0–30.0)

## 2015-05-16 ENCOUNTER — Telehealth: Payer: Self-pay | Admitting: Internal Medicine

## 2015-05-16 DIAGNOSIS — E785 Hyperlipidemia, unspecified: Secondary | ICD-10-CM

## 2015-05-16 DIAGNOSIS — E118 Type 2 diabetes mellitus with unspecified complications: Secondary | ICD-10-CM

## 2015-05-16 NOTE — Telephone Encounter (Signed)
Call back with labs results

## 2015-05-17 MED ORDER — PRAVASTATIN SODIUM 20 MG PO TABS: 20.0000 mg | ORAL_TABLET | Freq: Every day | ORAL | Status: DC

## 2015-05-17 NOTE — Telephone Encounter (Signed)
Will try pravachol

## 2015-05-17 NOTE — Telephone Encounter (Signed)
Pt called with labs results. He denies diabetes medication at this time and will adjust his dietary lifestyle since he will be retiring soon and as willing to recheck the level in a few months. He also inquired about alternative for statin since last one cuase muscle cramps. He is going to try a an omega 3 and diet adjustments

## 2015-09-25 ENCOUNTER — Other Ambulatory Visit: Payer: Self-pay | Admitting: Internal Medicine

## 2015-09-25 ENCOUNTER — Encounter: Payer: Self-pay | Admitting: Internal Medicine

## 2015-09-25 DIAGNOSIS — I10 Essential (primary) hypertension: Secondary | ICD-10-CM

## 2015-09-25 MED ORDER — HYDROCHLOROTHIAZIDE 25 MG PO TABS: 25.0000 mg | ORAL_TABLET | Freq: Every day | ORAL | Status: DC

## 2015-12-28 DIAGNOSIS — Z23 Encounter for immunization: Secondary | ICD-10-CM | POA: Diagnosis not present

## 2016-03-21 ENCOUNTER — Encounter: Payer: Self-pay | Admitting: Internal Medicine

## 2016-03-21 DIAGNOSIS — I10 Essential (primary) hypertension: Secondary | ICD-10-CM

## 2016-03-22 MED ORDER — HYDROCHLOROTHIAZIDE 25 MG PO TABS: 25.0000 mg | ORAL_TABLET | Freq: Every day | ORAL | 0 refills | Status: DC

## 2016-03-24 ENCOUNTER — Other Ambulatory Visit: Payer: Self-pay | Admitting: Internal Medicine

## 2016-03-24 DIAGNOSIS — I10 Essential (primary) hypertension: Secondary | ICD-10-CM

## 2016-05-18 ENCOUNTER — Other Ambulatory Visit: Payer: Self-pay | Admitting: Internal Medicine

## 2016-05-18 DIAGNOSIS — E118 Type 2 diabetes mellitus with unspecified complications: Secondary | ICD-10-CM

## 2016-05-18 DIAGNOSIS — E785 Hyperlipidemia, unspecified: Secondary | ICD-10-CM

## 2016-06-13 ENCOUNTER — Other Ambulatory Visit: Payer: Medicare Other

## 2016-06-13 ENCOUNTER — Ambulatory Visit (INDEPENDENT_AMBULATORY_CARE_PROVIDER_SITE_OTHER): Payer: Medicare Other | Admitting: Internal Medicine

## 2016-06-13 ENCOUNTER — Other Ambulatory Visit (INDEPENDENT_AMBULATORY_CARE_PROVIDER_SITE_OTHER): Payer: Medicare Other

## 2016-06-13 ENCOUNTER — Encounter: Payer: Self-pay | Admitting: Internal Medicine

## 2016-06-13 VITALS — BP 138/90 | HR 93 | Temp 98.6°F | Resp 16 | Ht 69.0 in | Wt 230.0 lb

## 2016-06-13 DIAGNOSIS — I10 Essential (primary) hypertension: Secondary | ICD-10-CM

## 2016-06-13 DIAGNOSIS — Z1159 Encounter for screening for other viral diseases: Secondary | ICD-10-CM | POA: Insufficient documentation

## 2016-06-13 DIAGNOSIS — Z7251 High risk heterosexual behavior: Secondary | ICD-10-CM

## 2016-06-13 DIAGNOSIS — Z0000 Encounter for general adult medical examination without abnormal findings: Secondary | ICD-10-CM

## 2016-06-13 DIAGNOSIS — E118 Type 2 diabetes mellitus with unspecified complications: Secondary | ICD-10-CM

## 2016-06-13 DIAGNOSIS — N400 Enlarged prostate without lower urinary tract symptoms: Secondary | ICD-10-CM | POA: Diagnosis not present

## 2016-06-13 DIAGNOSIS — Z23 Encounter for immunization: Secondary | ICD-10-CM | POA: Diagnosis not present

## 2016-06-13 DIAGNOSIS — E785 Hyperlipidemia, unspecified: Secondary | ICD-10-CM

## 2016-06-13 LAB — URINALYSIS, ROUTINE W REFLEX MICROSCOPIC
Bilirubin Urine: NEGATIVE
Ketones, ur: NEGATIVE
LEUKOCYTES UA: NEGATIVE
NITRITE: NEGATIVE
PH: 5.5 (ref 5.0–8.0)
RBC / HPF: NONE SEEN (ref 0–?)
SPECIFIC GRAVITY, URINE: 1.02 (ref 1.000–1.030)
Total Protein, Urine: NEGATIVE
Urine Glucose: NEGATIVE
Urobilinogen, UA: 0.2 (ref 0.0–1.0)

## 2016-06-13 LAB — CBC WITH DIFFERENTIAL/PLATELET
Basophils Absolute: 0.1 10*3/uL (ref 0.0–0.1)
Basophils Relative: 1.3 % (ref 0.0–3.0)
EOS ABS: 0.1 10*3/uL (ref 0.0–0.7)
Eosinophils Relative: 1.4 % (ref 0.0–5.0)
HCT: 49.6 % (ref 39.0–52.0)
Hemoglobin: 17 g/dL (ref 13.0–17.0)
LYMPHS ABS: 1.8 10*3/uL (ref 0.7–4.0)
Lymphocytes Relative: 23.2 % (ref 12.0–46.0)
MCHC: 34.2 g/dL (ref 30.0–36.0)
MCV: 84.4 fl (ref 78.0–100.0)
MONO ABS: 0.9 10*3/uL (ref 0.1–1.0)
Monocytes Relative: 11.8 % (ref 3.0–12.0)
NEUTROS PCT: 62.3 % (ref 43.0–77.0)
Neutro Abs: 4.9 10*3/uL (ref 1.4–7.7)
Platelets: 236 10*3/uL (ref 150.0–400.0)
RBC: 5.88 Mil/uL — AB (ref 4.22–5.81)
RDW: 14.6 % (ref 11.5–15.5)
WBC: 7.9 10*3/uL (ref 4.0–10.5)

## 2016-06-13 LAB — COMPREHENSIVE METABOLIC PANEL
ALK PHOS: 33 U/L — AB (ref 39–117)
ALT: 65 U/L — ABNORMAL HIGH (ref 0–53)
AST: 65 U/L — ABNORMAL HIGH (ref 0–37)
Albumin: 4.8 g/dL (ref 3.5–5.2)
BILIRUBIN TOTAL: 0.8 mg/dL (ref 0.2–1.2)
BUN: 22 mg/dL (ref 6–23)
CO2: 28 mEq/L (ref 19–32)
Calcium: 10 mg/dL (ref 8.4–10.5)
Chloride: 100 mEq/L (ref 96–112)
Creatinine, Ser: 1.08 mg/dL (ref 0.40–1.50)
GFR: 72.67 mL/min (ref >60.00)
GLUCOSE: 142 mg/dL — AB (ref 70–99)
Potassium: 4.4 mEq/L (ref 3.5–5.1)
SODIUM: 139 meq/L (ref 135–145)
TOTAL PROTEIN: 7.5 g/dL (ref 6.0–8.3)

## 2016-06-13 LAB — TSH: TSH: 1.88 u[IU]/mL (ref 0.35–4.50)

## 2016-06-13 LAB — LIPID PANEL
Cholesterol: 187 mg/dL (ref 0–200)
HDL: 49 mg/dL (ref >39.00)
LDL Cholesterol: 107 mg/dL — ABNORMAL HIGH (ref 0–99)
NONHDL: 138.21
Total CHOL/HDL Ratio: 4
Triglycerides: 154 mg/dL — ABNORMAL HIGH (ref 0.0–149.0)
VLDL: 30.8 mg/dL (ref 0.0–40.0)

## 2016-06-13 LAB — POCT GLYCOSYLATED HEMOGLOBIN (HGB A1C): HEMOGLOBIN A1C: 7.1

## 2016-06-13 LAB — MICROALBUMIN / CREATININE URINE RATIO
CREATININE, U: 194.4 mg/dL
MICROALB UR: 3.1 mg/dL — AB (ref 0.0–1.9)
MICROALB/CREAT RATIO: 1.6 mg/g (ref 0.0–30.0)

## 2016-06-13 LAB — HEPATITIS C ANTIBODY: HCV Ab: NEGATIVE

## 2016-06-13 LAB — PSA: PSA: 1.37 ng/mL (ref 0.10–4.00)

## 2016-06-13 MED ORDER — METFORMIN HCL ER (OSM) 1000 MG PO TB24: 1000.0000 mg | ORAL_TABLET | Freq: Every day | ORAL | 1 refills | Status: DC

## 2016-06-13 MED ORDER — TELMISARTAN 40 MG PO TABS: 40.0000 mg | ORAL_TABLET | Freq: Every day | ORAL | 1 refills | Status: DC

## 2016-06-13 NOTE — Patient Instructions (Signed)
 Health Maintenance, Male A healthy lifestyle and preventive care is important for your health and wellness. Ask your health care provider about what schedule of regular examinations is right for you. What should I know about weight and diet?  Eat a Healthy Diet  Eat plenty of vegetables, fruits, whole grains, low-fat dairy products, and lean protein.  Do not eat a lot of foods high in solid fats, added sugars, or salt. Maintain a Healthy Weight  Regular exercise can help you achieve or maintain a healthy weight. You should:  Do at least 150 minutes of exercise each week. The exercise should increase your heart rate and make you sweat (moderate-intensity exercise).  Do strength-training exercises at least twice a week. Watch Your Levels of Cholesterol and Blood Lipids  Have your blood tested for lipids and cholesterol every 5 years starting at 66 years of age. If you are at high risk for heart disease, you should start having your blood tested when you are 66 years old. You may need to have your cholesterol levels checked more often if:  Your lipid or cholesterol levels are high.  You are older than 66 years of age.  You are at high risk for heart disease. What should I know about cancer screening? Many types of cancers can be detected early and may often be prevented. Lung Cancer  You should be screened every year for lung cancer if:  You are a current smoker who has smoked for at least 30 years.  You are a former smoker who has quit within the past 15 years.  Talk to your health care provider about your screening options, when you should start screening, and how often you should be screened. Colorectal Cancer  Routine colorectal cancer screening usually begins at 66 years of age and should be repeated every 5-10 years until you are 66 years old. You may need to be screened more often if early forms of precancerous polyps or small growths are found. Your health care provider  may recommend screening at an earlier age if you have risk factors for colon cancer.  Your health care provider may recommend using home test kits to check for hidden blood in the stool.  A small camera at the end of a tube can be used to examine your colon (sigmoidoscopy or colonoscopy). This checks for the earliest forms of colorectal cancer. Prostate and Testicular Cancer  Depending on your age and overall health, your health care provider may do certain tests to screen for prostate and testicular cancer.  Talk to your health care provider about any symptoms or concerns you have about testicular or prostate cancer. Skin Cancer  Check your skin from head to toe regularly.  Tell your health care provider about any new moles or changes in moles, especially if:  There is a change in a mole's size, shape, or color.  You have a mole that is larger than a pencil eraser.  Always use sunscreen. Apply sunscreen liberally and repeat throughout the day.  Protect yourself by wearing long sleeves, pants, a wide-brimmed hat, and sunglasses when outside. What should I know about heart disease, diabetes, and high blood pressure?  If you are 18-39 years of age, have your blood pressure checked every 3-5 years. If you are 40 years of age or older, have your blood pressure checked every year. You should have your blood pressure measured twice-once when you are at a hospital or clinic, and once when you are not at   a hospital or clinic. Record the average of the two measurements. To check your blood pressure when you are not at a hospital or clinic, you can use:  An automated blood pressure machine at a pharmacy.  A home blood pressure monitor.  Talk to your health care provider about your target blood pressure.  If you are between 45-79 years old, ask your health care provider if you should take aspirin to prevent heart disease.  Have regular diabetes screenings by checking your fasting blood sugar  level.  If you are at a normal weight and have a low risk for diabetes, have this test once every three years after the age of 45.  If you are overweight and have a high risk for diabetes, consider being tested at a younger age or more often.  A one-time screening for abdominal aortic aneurysm (AAA) by ultrasound is recommended for men aged 65-75 years who are current or former smokers. What should I know about preventing infection? Hepatitis B  If you have a higher risk for hepatitis B, you should be screened for this virus. Talk with your health care provider to find out if you are at risk for hepatitis B infection. Hepatitis C  Blood testing is recommended for:  Everyone born from 1945 through 1965.  Anyone with known risk factors for hepatitis C. Sexually Transmitted Diseases (STDs)  You should be screened each year for STDs including gonorrhea and chlamydia if:  You are sexually active and are younger than 66 years of age.  You are older than 66 years of age and your health care provider tells you that you are at risk for this type of infection.  Your sexual activity has changed since you were last screened and you are at an increased risk for chlamydia or gonorrhea. Ask your health care provider if you are at risk.  Talk with your health care provider about whether you are at high risk of being infected with HIV. Your health care provider may recommend a prescription medicine to help prevent HIV infection. What else can I do?  Schedule regular health, dental, and eye exams.  Stay current with your vaccines (immunizations).  Do not use any tobacco products, such as cigarettes, chewing tobacco, and e-cigarettes. If you need help quitting, ask your health care provider.  Limit alcohol intake to no more than 2 drinks per day. One drink equals 12 ounces of beer, 5 ounces of wine, or 1 ounces of hard liquor.  Do not use street drugs.  Do not share needles.  Ask your health  care provider for help if you need support or information about quitting drugs.  Tell your health care provider if you often feel depressed.  Tell your health care provider if you have ever been abused or do not feel safe at home. This information is not intended to replace advice given to you by your health care provider. Make sure you discuss any questions you have with your health care provider. Document Released: 09/14/2007 Document Revised: 11/15/2015 Document Reviewed: 12/20/2014 Elsevier Interactive Patient Education  2017 [employer]  

## 2016-06-13 NOTE — Progress Notes (Signed)
Subjective:  Patient ID: Devon Payne, male    DOB: 03-14-1951  Age: 66 y.o. MRN: [id number]  CC: Annual Exam; Hypertension; Hyperlipidemia; and Diabetes   HPI Devon Payne presents for an AWV/CPX.  He complains of some weight gain and nocturia about 2 times per night but otherwise feels well and offers no complaints. He thinks his blood pressure has been well controlled and he denies headache, blurred vision, chest pain, shortness of breath, palpitations, edema, or fatigue. He has not taken his statin for several weeks because he ran out.  Past Medical History:  Diagnosis Date  . Chronic kidney disease    kidney stones  . History of chicken pox   . Hypertension    Past Surgical History:  Procedure Laterality Date  . [place]  . POSTERIOR CERVICAL FUSION/FORAMINOTOMY  04/26/2011   Procedure: POSTERIOR CERVICAL FUSION/FORAMINOTOMY LEVEL 3;  Surgeon: Elaina Hoops, MD;  Location: [facility];  Service: Neurosurgery;  Laterality: Bilateral;  Right Cervical five-six,, Left Cervical six-seven, Right Cervical seven thoracic one Posterior Cervical Foraminotomy/ Diskectomy 3hrs Rm 33 (to follow)    reports that he has never smoked. He has never used smokeless tobacco. He reports that he does not drink alcohol or use drugs. family history includes Cancer in his mother; Diabetes in his father; Heart disease in his father. Allergies  Allergen Reactions  . Atorvastatin Other (See Comments)    Muscle aches  . Amoxicillin Rash    Outpatient Medications Prior to Visit  Medication Sig Dispense Refill  . aspirin EC 81 MG tablet Take 1 tablet (81 mg total) by mouth daily. 90 tablet 3  . hydrochlorothiazide (HYDRODIURIL) 25 MG tablet Take 1 tablet (25 mg total) by mouth daily. 90 tablet 0  . pravastatin (PRAVACHOL) 20 MG tablet Take 1 tablet (20 mg total) by mouth daily. 90 tablet 3  . hydrochlorothiazide (HYDRODIURIL) 25 MG tablet TAKE 1 TABLET(25 MG) BY MOUTH DAILY 90  tablet 0   No facility-administered medications prior to visit.     ROS Review of Systems  Constitutional: Positive for unexpected weight change (wt gain). Negative for activity change, appetite change, chills, diaphoresis and fatigue.  Eyes: Negative for visual disturbance.  Respiratory: Negative.  Negative for cough, chest tightness, shortness of breath, wheezing and stridor.   Cardiovascular: Negative for chest pain, palpitations and leg swelling.  Gastrointestinal: Negative.  Negative for abdominal pain, constipation, diarrhea, nausea and vomiting.  Endocrine: Negative.  Negative for cold intolerance, heat intolerance, polydipsia, polyphagia and polyuria.  Genitourinary: Negative for decreased urine volume, difficulty urinating, dysuria, flank pain, frequency, hematuria, penile pain, penile swelling, scrotal swelling, testicular pain and urgency.       + nocturia 2-3 times per night  Musculoskeletal: Negative.  Negative for back pain, joint swelling, myalgias and neck pain.  Skin: Negative.   Allergic/Immunologic: Negative.   Neurological: Negative.  Negative for dizziness, weakness, light-headedness, numbness and headaches.  Hematological: Negative for adenopathy. Does not bruise/bleed easily.  Psychiatric/Behavioral: Negative.     Objective:  BP 138/90 (BP Location: Left Arm, Patient Position: Sitting, Cuff Size: Large)   Pulse 93   Temp 98.6 F (37 C) (Oral)   Resp 16   Ht 5\' 9"  (1.753 m)   Wt 230 lb (104.3 kg)   SpO2 95%   BMI 33.97 kg/m   BP Readings from Last 3 Encounters:  06/13/16 138/90  05/10/15 140/90  11/02/14 126/84    Wt Readings from Last  3 Encounters:  06/13/16 230 lb (104.3 kg)  05/10/15 222 lb (100.7 kg)  11/02/14 222 lb (100.7 kg)    Physical Exam  Constitutional: He is oriented to person, place, and time. No distress.  HENT:  Mouth/Throat: Oropharynx is clear and moist. No oropharyngeal exudate.  Eyes: Conjunctivae are normal. Right eye  exhibits no discharge. Left eye exhibits no discharge. No scleral icterus.  Neck: Normal range of motion. Neck supple. No JVD present. No tracheal deviation present. No thyromegaly present.  Cardiovascular: Normal rate, regular rhythm, normal heart sounds and intact distal pulses.  Exam reveals no gallop and no friction rub.   No murmur heard. Pulmonary/Chest: Effort normal and breath sounds normal. No stridor. No respiratory distress. He has no wheezes. He has no rales. He exhibits no tenderness.  Abdominal: Soft. Bowel sounds are normal. He exhibits no distension and no mass. There is no tenderness. There is no rebound and no guarding. Hernia confirmed negative in the right inguinal area and confirmed negative in the left inguinal area.  Genitourinary: Rectum normal, testes normal and penis normal. Rectal exam shows no external hemorrhoid, no internal hemorrhoid, no fissure, no mass, no tenderness, anal tone normal and guaiac negative stool. Prostate is enlarged (1+ smooth symm BPH). Prostate is not tender. Right testis shows no mass, no swelling and no tenderness. Right testis is descended. Left testis shows no mass, no swelling and no tenderness. Left testis is descended. Circumcised. No penile erythema or penile tenderness. No discharge found.  Musculoskeletal: Normal range of motion. He exhibits no edema, tenderness or deformity.  Lymphadenopathy:    He has no cervical adenopathy.       Right: No inguinal adenopathy present.       Left: No inguinal adenopathy present.  Neurological: He is oriented to person, place, and time.  Skin: Skin is warm and dry. No rash noted. He is not diaphoretic. No erythema. No pallor.  Psychiatric: He has a normal mood and affect. His behavior is normal. Judgment and thought content normal.  Vitals reviewed.   Lab Results  Component Value Date   WBC 7.9 06/13/2016   HGB 17.0 06/13/2016   HCT 49.6 06/13/2016   PLT 236.0 06/13/2016   GLUCOSE 142 (H)  06/13/2016   CHOL 187 06/13/2016   TRIG 154.0 (H) 06/13/2016   HDL 49.00 06/13/2016   LDLDIRECT 141.0 05/11/2015   LDLCALC 107 (H) 06/13/2016   ALT 65 (H) 06/13/2016   AST 65 (H) 06/13/2016   NA 139 06/13/2016   K 4.4 06/13/2016   CL 100 06/13/2016   CREATININE 1.08 06/13/2016   BUN 22 06/13/2016   CO2 28 06/13/2016   TSH 1.88 06/13/2016   PSA 1.37 06/13/2016   HGBA1C 7.1 06/13/2016   MICROALBUR 3.1 (H) 06/13/2016    Dg Chest 2 View  Result Date: 04/19/2011 *RADIOLOGY REPORT* Clinical Data: Preop for cervical spine surgery CHEST - 2 VIEW Comparison: None. Findings: Cardiomediastinal silhouette is unremarkable.  Mild thoracic dextroscoliosis.  Mild degenerative changes mid thoracic spine.  No acute infiltrate or pleural effusion.  No pulmonary edema.  Minimal elevation of the right hemidiaphragm. IMPRESSION: No active disease.  Mild degenerative changes thoracic spine. Original Report Authenticated By: Lahoma Crocker, M.D.   Assessment & Plan:   Loki was seen today for annual exam, hypertension, hyperlipidemia and diabetes.  Diagnoses and all orders for this visit:  Need for Streptococcus pneumoniae vaccination -     Pneumococcal conjugate vaccine 13-valent  Essential hypertension- his blood  pressure is not adequately well controlled so I've asked him to add an ARB, his labs are negative for any end organ damage or secondary causes of hypertension. -     Comprehensive metabolic panel; Future -     CBC with Differential/Platelet; Future -     telmisartan (MICARDIS) 40 MG tablet; Take 1 tablet (40 mg total) by mouth daily.  Type 2 diabetes mellitus with complication, without long-term current use of insulin ([place])- his A1c remains just above 7 so I have asked him to start taking metformin. -     Comprehensive metabolic panel; Future -     Cancel: Hemoglobin A1c; Future -     Microalbumin / creatinine urine ratio; Future -     Ambulatory referral to Ophthalmology -     telmisartan  (MICARDIS) 40 MG tablet; Take 1 tablet (40 mg total) by mouth daily. -     metformin (FORTAMET) 1000 MG (OSM) 24 hr tablet; Take 1 tablet (1,000 mg total) by mouth daily with breakfast. -     POCT glycosylated hemoglobin (Hb A1C)  Hyperlipidemia with target LDL less than 100- he is not quite achieved his LDL goal so I've asked him to restart the statin. -     Lipid panel; Future -     TSH; Future  Benign prostatic hyperplasia without lower urinary tract symptoms- he has mild nocturia but no other symptoms that need to be treated, his examination PSA are not concerning for prostate cancer. -     PSA; Future -     Urinalysis, Routine w reflex microscopic; Future  Need for hepatitis C screening test -     Hepatitis C antibody; Future  High risk sexual behavior -     HIV antibody; Future  Routine general medical examination at a health care facility   I am having Mr. Grove start on telmisartan and metformin. I am also having him maintain his aspirin EC and hydrochlorothiazide.  Meds ordered this encounter  Medications  . telmisartan (MICARDIS) 40 MG tablet    Sig: Take 1 tablet (40 mg total) by mouth daily.    Dispense:  90 tablet    Refill:  1  . metformin (FORTAMET) 1000 MG (OSM) 24 hr tablet    Sig: Take 1 tablet (1,000 mg total) by mouth daily with breakfast.    Dispense:  90 tablet    Refill:  1   See AVS for instructions about healthy living and anticipatory guidance.   Follow-up: Return in about 6 months (around 12/14/2016).  Scarlette Calico, MD

## 2016-06-13 NOTE — Progress Notes (Signed)
Pre visit review using our clinic review tool, if applicable. No additional management support is needed unless otherwise documented below in the visit note. 

## 2016-06-14 ENCOUNTER — Encounter: Payer: Self-pay | Admitting: Internal Medicine

## 2016-06-14 LAB — HIV ANTIBODY (ROUTINE TESTING W REFLEX): HIV: NONREACTIVE

## 2016-06-16 ENCOUNTER — Encounter: Payer: Self-pay | Admitting: Internal Medicine

## 2016-06-16 ENCOUNTER — Other Ambulatory Visit: Payer: Self-pay | Admitting: Internal Medicine

## 2016-06-16 DIAGNOSIS — E785 Hyperlipidemia, unspecified: Secondary | ICD-10-CM

## 2016-06-16 DIAGNOSIS — E118 Type 2 diabetes mellitus with unspecified complications: Secondary | ICD-10-CM

## 2016-06-16 DIAGNOSIS — Z0000 Encounter for general adult medical examination without abnormal findings: Secondary | ICD-10-CM | POA: Insufficient documentation

## 2016-06-16 NOTE — Assessment & Plan Note (Signed)
Lab Results  Component Value Date   WBC 7.9 06/13/2016   HGB 17.0 06/13/2016   HCT 49.6 06/13/2016   PLT 236.0 06/13/2016   GLUCOSE 142 (H) 06/13/2016   CHOL 187 06/13/2016   TRIG 154.0 (H) 06/13/2016   HDL 49.00 06/13/2016   LDLDIRECT 141.0 05/11/2015   LDLCALC 107 (H) 06/13/2016   ALT 65 (H) 06/13/2016   AST 65 (H) 06/13/2016   NA 139 06/13/2016   K 4.4 06/13/2016   CL 100 06/13/2016   CREATININE 1.08 06/13/2016   BUN 22 06/13/2016   CO2 28 06/13/2016   TSH 1.88 06/13/2016   PSA 1.37 06/13/2016   HGBA1C 7.1 06/13/2016   MICROALBUR 3.1 (H) 06/13/2016

## 2016-06-17 ENCOUNTER — Encounter: Payer: Self-pay | Admitting: Internal Medicine

## 2016-06-17 ENCOUNTER — Other Ambulatory Visit: Payer: Self-pay | Admitting: Internal Medicine

## 2016-06-17 DIAGNOSIS — I10 Essential (primary) hypertension: Secondary | ICD-10-CM

## 2016-06-17 DIAGNOSIS — E118 Type 2 diabetes mellitus with unspecified complications: Secondary | ICD-10-CM

## 2016-06-17 MED ORDER — LOSARTAN POTASSIUM 100 MG PO TABS: 100.0000 mg | ORAL_TABLET | Freq: Every day | ORAL | 1 refills | Status: DC

## 2016-06-17 MED ORDER — METFORMIN HCL 500 MG PO TABS: 500.0000 mg | ORAL_TABLET | Freq: Two times a day (BID) | ORAL | 1 refills | Status: DC

## 2016-06-18 ENCOUNTER — Encounter: Payer: Self-pay | Admitting: Internal Medicine

## 2016-06-25 ENCOUNTER — Other Ambulatory Visit: Payer: Self-pay | Admitting: Internal Medicine

## 2016-06-25 ENCOUNTER — Telehealth: Payer: Self-pay | Admitting: Internal Medicine

## 2016-06-25 DIAGNOSIS — I10 Essential (primary) hypertension: Secondary | ICD-10-CM

## 2016-06-25 NOTE — Telephone Encounter (Signed)
Patient states that metformin is causing a lot of diarrhea.  Would like to know if medication can be changed or what needs to be done in regard.  Patient states an hour after he takes the medication he is in the restroom.

## 2016-06-25 NOTE — Telephone Encounter (Signed)
Stop the metformin - will reconsider when I check his A1C again

## 2016-06-26 NOTE — Telephone Encounter (Signed)
LVM for pt to call back as soon as possible.   RE: MD response and also sent pt a my chart message.

## 2016-07-23 DIAGNOSIS — D3131 Benign neoplasm of right choroid: Secondary | ICD-10-CM | POA: Diagnosis not present

## 2016-07-23 DIAGNOSIS — H359 Unspecified retinal disorder: Secondary | ICD-10-CM | POA: Diagnosis not present

## 2016-07-23 DIAGNOSIS — E1165 Type 2 diabetes mellitus with hyperglycemia: Secondary | ICD-10-CM | POA: Diagnosis not present

## 2016-07-23 LAB — HM DIABETES EYE EXAM

## 2016-07-29 ENCOUNTER — Encounter: Payer: Self-pay | Admitting: Internal Medicine

## 2016-12-18 DIAGNOSIS — Z23 Encounter for immunization: Secondary | ICD-10-CM | POA: Diagnosis not present

## 2016-12-20 ENCOUNTER — Other Ambulatory Visit: Payer: Self-pay | Admitting: Internal Medicine

## 2016-12-20 DIAGNOSIS — E118 Type 2 diabetes mellitus with unspecified complications: Secondary | ICD-10-CM

## 2016-12-20 DIAGNOSIS — I10 Essential (primary) hypertension: Secondary | ICD-10-CM

## 2017-03-23 ENCOUNTER — Other Ambulatory Visit: Payer: Self-pay | Admitting: Internal Medicine

## 2017-03-23 DIAGNOSIS — I10 Essential (primary) hypertension: Secondary | ICD-10-CM

## 2017-05-06 DIAGNOSIS — R05 Cough: Secondary | ICD-10-CM | POA: Diagnosis not present

## 2017-05-06 DIAGNOSIS — J0100 Acute maxillary sinusitis, unspecified: Secondary | ICD-10-CM | POA: Diagnosis not present

## 2017-06-20 ENCOUNTER — Other Ambulatory Visit: Payer: Self-pay | Admitting: Internal Medicine

## 2017-06-20 DIAGNOSIS — E118 Type 2 diabetes mellitus with unspecified complications: Secondary | ICD-10-CM

## 2017-06-20 DIAGNOSIS — I10 Essential (primary) hypertension: Secondary | ICD-10-CM

## 2017-06-21 ENCOUNTER — Other Ambulatory Visit: Payer: Self-pay | Admitting: Internal Medicine

## 2017-06-21 DIAGNOSIS — E118 Type 2 diabetes mellitus with unspecified complications: Secondary | ICD-10-CM

## 2017-06-21 DIAGNOSIS — E785 Hyperlipidemia, unspecified: Secondary | ICD-10-CM

## 2017-06-22 ENCOUNTER — Other Ambulatory Visit: Payer: Self-pay | Admitting: Internal Medicine

## 2017-06-22 DIAGNOSIS — I10 Essential (primary) hypertension: Secondary | ICD-10-CM

## 2017-09-20 ENCOUNTER — Other Ambulatory Visit: Payer: Self-pay | Admitting: Internal Medicine

## 2017-09-20 DIAGNOSIS — E118 Type 2 diabetes mellitus with unspecified complications: Secondary | ICD-10-CM

## 2017-09-20 DIAGNOSIS — I10 Essential (primary) hypertension: Secondary | ICD-10-CM

## 2017-09-20 DIAGNOSIS — E785 Hyperlipidemia, unspecified: Secondary | ICD-10-CM

## 2017-12-17 ENCOUNTER — Other Ambulatory Visit: Payer: Self-pay | Admitting: Internal Medicine

## 2017-12-17 DIAGNOSIS — I10 Essential (primary) hypertension: Secondary | ICD-10-CM

## 2017-12-17 DIAGNOSIS — E118 Type 2 diabetes mellitus with unspecified complications: Secondary | ICD-10-CM

## 2017-12-18 ENCOUNTER — Other Ambulatory Visit: Payer: Self-pay | Admitting: Internal Medicine

## 2017-12-18 DIAGNOSIS — I10 Essential (primary) hypertension: Secondary | ICD-10-CM

## 2017-12-18 DIAGNOSIS — E785 Hyperlipidemia, unspecified: Secondary | ICD-10-CM

## 2017-12-18 DIAGNOSIS — E118 Type 2 diabetes mellitus with unspecified complications: Secondary | ICD-10-CM

## 2017-12-22 ENCOUNTER — Encounter: Payer: Self-pay | Admitting: Internal Medicine

## 2017-12-22 ENCOUNTER — Other Ambulatory Visit: Payer: Self-pay | Admitting: Internal Medicine

## 2017-12-22 DIAGNOSIS — E118 Type 2 diabetes mellitus with unspecified complications: Secondary | ICD-10-CM

## 2017-12-22 DIAGNOSIS — I10 Essential (primary) hypertension: Secondary | ICD-10-CM

## 2017-12-22 DIAGNOSIS — E785 Hyperlipidemia, unspecified: Secondary | ICD-10-CM

## 2017-12-22 NOTE — Telephone Encounter (Signed)
Pt states that he has sent a request to schedule an appt.

## 2017-12-23 MED ORDER — PRAVASTATIN SODIUM 20 MG PO TABS: 20.0000 mg | ORAL_TABLET | Freq: Every day | ORAL | 0 refills | Status: DC

## 2017-12-23 MED ORDER — HYDROCHLOROTHIAZIDE 25 MG PO TABS: 25.0000 mg | ORAL_TABLET | Freq: Every day | ORAL | 0 refills | Status: DC

## 2018-01-05 DIAGNOSIS — Z23 Encounter for immunization: Secondary | ICD-10-CM | POA: Diagnosis not present

## 2018-01-15 ENCOUNTER — Ambulatory Visit: Payer: Medicare Other | Admitting: Internal Medicine

## 2018-01-21 ENCOUNTER — Ambulatory Visit (INDEPENDENT_AMBULATORY_CARE_PROVIDER_SITE_OTHER): Payer: Medicare Other | Admitting: Internal Medicine

## 2018-01-21 ENCOUNTER — Encounter: Payer: Self-pay | Admitting: Internal Medicine

## 2018-01-21 ENCOUNTER — Other Ambulatory Visit (INDEPENDENT_AMBULATORY_CARE_PROVIDER_SITE_OTHER): Payer: Medicare Other

## 2018-01-21 VITALS — BP 110/80 | HR 98 | Temp 97.9°F | Resp 16 | Ht 66.75 in | Wt 227.0 lb

## 2018-01-21 DIAGNOSIS — E118 Type 2 diabetes mellitus with unspecified complications: Secondary | ICD-10-CM

## 2018-01-21 DIAGNOSIS — Z0000 Encounter for general adult medical examination without abnormal findings: Secondary | ICD-10-CM

## 2018-01-21 DIAGNOSIS — I10 Essential (primary) hypertension: Secondary | ICD-10-CM

## 2018-01-21 DIAGNOSIS — E785 Hyperlipidemia, unspecified: Secondary | ICD-10-CM

## 2018-01-21 DIAGNOSIS — R945 Abnormal results of liver function studies: Secondary | ICD-10-CM | POA: Diagnosis not present

## 2018-01-21 DIAGNOSIS — N400 Enlarged prostate without lower urinary tract symptoms: Secondary | ICD-10-CM

## 2018-01-21 DIAGNOSIS — Z23 Encounter for immunization: Secondary | ICD-10-CM

## 2018-01-21 DIAGNOSIS — R7989 Other specified abnormal findings of blood chemistry: Secondary | ICD-10-CM | POA: Insufficient documentation

## 2018-01-21 LAB — CBC WITH DIFFERENTIAL/PLATELET
Basophils Absolute: 0.1 10*3/uL (ref 0.0–0.1)
Basophils Relative: 1.3 % (ref 0.0–3.0)
EOS ABS: 0.1 10*3/uL (ref 0.0–0.7)
EOS PCT: 1.4 % (ref 0.0–5.0)
HCT: 47.6 % (ref 39.0–52.0)
Hemoglobin: 16.7 g/dL (ref 13.0–17.0)
LYMPHS ABS: 1.9 10*3/uL (ref 0.7–4.0)
Lymphocytes Relative: 24.9 % (ref 12.0–46.0)
MCHC: 35 g/dL (ref 30.0–36.0)
MCV: 84.3 fl (ref 78.0–100.0)
MONO ABS: 0.9 10*3/uL (ref 0.1–1.0)
Monocytes Relative: 12.1 % — ABNORMAL HIGH (ref 3.0–12.0)
NEUTROS PCT: 60.3 % (ref 43.0–77.0)
Neutro Abs: 4.6 10*3/uL (ref 1.4–7.7)
Platelets: 250 10*3/uL (ref 150.0–400.0)
RBC: 5.65 Mil/uL (ref 4.22–5.81)
RDW: 14.2 % (ref 11.5–15.5)
WBC: 7.7 10*3/uL (ref 4.0–10.5)

## 2018-01-21 LAB — URINALYSIS, ROUTINE W REFLEX MICROSCOPIC
Bilirubin Urine: NEGATIVE
Ketones, ur: NEGATIVE
Leukocytes, UA: NEGATIVE
Nitrite: NEGATIVE
RBC / HPF: NONE SEEN (ref 0–?)
SPECIFIC GRAVITY, URINE: 1.02 (ref 1.000–1.030)
Total Protein, Urine: NEGATIVE
URINE GLUCOSE: NEGATIVE
Urobilinogen, UA: 0.2 (ref 0.0–1.0)
pH: 5.5 (ref 5.0–8.0)

## 2018-01-21 LAB — COMPREHENSIVE METABOLIC PANEL
ALT: 50 U/L (ref 0–53)
AST: 41 U/L — AB (ref 0–37)
Albumin: 5 g/dL (ref 3.5–5.2)
Alkaline Phosphatase: 35 U/L — ABNORMAL LOW (ref 39–117)
BUN: 24 mg/dL — AB (ref 6–23)
CO2: 25 mEq/L (ref 19–32)
CREATININE: 1.11 mg/dL (ref 0.40–1.50)
Calcium: 9.9 mg/dL (ref 8.4–10.5)
Chloride: 99 mEq/L (ref 96–112)
GFR: 70.07 mL/min (ref >60.00)
GLUCOSE: 155 mg/dL — AB (ref 70–99)
POTASSIUM: 4.3 meq/L (ref 3.5–5.1)
SODIUM: 136 meq/L (ref 135–145)
TOTAL PROTEIN: 7.8 g/dL (ref 6.0–8.3)
Total Bilirubin: 0.9 mg/dL (ref 0.2–1.2)

## 2018-01-21 LAB — POCT GLYCOSYLATED HEMOGLOBIN (HGB A1C): Hemoglobin A1C: 7.2 % — AB (ref 4.0–5.6)

## 2018-01-21 LAB — MICROALBUMIN / CREATININE URINE RATIO
CREATININE, U: 145.5 mg/dL
MICROALB UR: 2.8 mg/dL — AB (ref 0.0–1.9)
MICROALB/CREAT RATIO: 1.9 mg/g (ref 0.0–30.0)

## 2018-01-21 LAB — TSH: TSH: 1.56 u[IU]/mL (ref 0.35–4.50)

## 2018-01-21 LAB — GLUCOSE, POCT (MANUAL RESULT ENTRY): POC GLUCOSE: 163 mg/dL — AB (ref 70–99)

## 2018-01-21 LAB — PSA: PSA: 1.61 ng/mL (ref 0.10–4.00)

## 2018-01-21 MED ORDER — DULAGLUTIDE 0.75 MG/0.5ML ~~LOC~~ SOAJ: 1.0000 | SUBCUTANEOUS | 1 refills | Status: DC

## 2018-01-21 NOTE — Progress Notes (Signed)
Subjective:  Patient ID: Devon Payne, male    DOB: 1950-08-11  Age: 67 y.o. MRN: [id number]  CC: Annual Exam; Hypertension; Hyperlipidemia; and Diabetes   HPI Devon Payne presents for a CPX.  He complains that his blood sugars are not well controlled and he has frequent urination and excessive thirst.  He tells me his blood pressure is well controlled.  He denies any recent episodes of CP, DOE, palpitations, edema, or fatigue.  Past Medical History:  Diagnosis Date  . Chronic kidney disease    kidney stones  . History of chicken pox   . Hypertension    Past Surgical History:  Procedure Laterality Date  . [place]  . POSTERIOR CERVICAL FUSION/FORAMINOTOMY  04/26/2011   Procedure: POSTERIOR CERVICAL FUSION/FORAMINOTOMY LEVEL 3;  Surgeon: Devon Hoops, MD;  Location: [facility];  Service: Neurosurgery;  Laterality: Bilateral;  Right Cervical five-six,, Left Cervical six-seven, Right Cervical seven thoracic one Posterior Cervical Foraminotomy/ Diskectomy 3hrs Rm 33 (to follow)    reports that he has never smoked. He has never used smokeless tobacco. He reports that he does not drink alcohol or use drugs. family history includes Cancer in his mother; Diabetes in his father; Heart disease in his father. Allergies  Allergen Reactions  . Metformin And Related Diarrhea  . Atorvastatin Other (See Comments)    Muscle aches  . Amoxicillin Rash    Outpatient Medications Prior to Visit  Medication Sig Dispense Refill  . aspirin EC 81 MG tablet Take 1 tablet (81 mg total) by mouth daily. 90 tablet 3  . hydrochlorothiazide (HYDRODIURIL) 25 MG tablet Take 1 tablet (25 mg total) by mouth daily. 90 tablet 0  . losartan (COZAAR) 100 MG tablet TAKE 1 TABLET BY MOUTH EVERY DAY 90 tablet 1  . pravastatin (PRAVACHOL) 20 MG tablet Take 1 tablet (20 mg total) by mouth daily. 90 tablet 0   No facility-administered medications prior to visit.     ROS Review of Systems    Constitutional: Negative.  Negative for diaphoresis, fatigue and unexpected weight change.  HENT: Negative.   Eyes: Negative for visual disturbance.  Respiratory: Negative.  Negative for cough, chest tightness, shortness of breath and wheezing.   Cardiovascular: Negative for chest pain, palpitations and leg swelling.  Gastrointestinal: Negative for abdominal pain, constipation, diarrhea, nausea and vomiting.  Endocrine: Positive for polyphagia and polyuria. Negative for polydipsia.  Genitourinary: Negative.  Negative for difficulty urinating, scrotal swelling, testicular pain and urgency.  Musculoskeletal: Negative.  Negative for arthralgias and myalgias.  Skin: Negative.   Allergic/Immunologic: Negative.   Neurological: Negative.  Negative for dizziness, weakness, light-headedness and headaches.  Hematological: Negative for adenopathy. Does not bruise/bleed easily.  Psychiatric/Behavioral: Negative.     Objective:  BP 110/80 (BP Location: Left Arm, Patient Position: Sitting, Cuff Size: Large)   Pulse 98   Temp 97.9 F (36.6 C) (Oral)   Resp 16   Ht 5' 6.75" (1.695 m)   Wt 227 lb (103 kg)   SpO2 96%   BMI 35.82 kg/m   BP Readings from Last 3 Encounters:  01/21/18 110/80  06/13/16 138/90  05/10/15 140/90    Wt Readings from Last 3 Encounters:  01/21/18 227 lb (103 kg)  06/13/16 230 lb (104.3 kg)  05/10/15 222 lb (100.7 kg)    Physical Exam  Constitutional: He is oriented to person, place, and time. No distress.  HENT:  Mouth/Throat: Oropharynx is clear and moist. No oropharyngeal  exudate.  Eyes: Conjunctivae are normal. No scleral icterus.  Neck: Normal range of motion. Neck supple. No JVD present. No thyromegaly present.  Cardiovascular: Normal rate, regular rhythm and normal heart sounds. Exam reveals no gallop.  No murmur heard. Pulmonary/Chest: Effort normal and breath sounds normal. No respiratory distress. He has no wheezes. He has no rales.  Abdominal: Soft.  Bowel sounds are normal. He exhibits no mass. There is no hepatosplenomegaly. There is no tenderness. Hernia confirmed negative in the right inguinal area and confirmed negative in the left inguinal area.  Genitourinary: Testes normal and penis normal. Rectal exam shows no external hemorrhoid, no internal hemorrhoid, no fissure, no mass, no tenderness, anal tone normal and guaiac negative stool. Prostate is enlarged (1+ smooth symm BPH). Prostate is not tender. Right testis shows no mass and no tenderness. Left testis shows no mass, no swelling and no tenderness. Circumcised. No penile tenderness. No discharge found.  Musculoskeletal: Normal range of motion. He exhibits no edema, tenderness or deformity.  Lymphadenopathy:    He has no cervical adenopathy. No inguinal adenopathy noted on the right or left side.  Neurological: He is alert and oriented to person, place, and time.  Skin: Skin is warm and dry. No rash noted. He is not diaphoretic.  Vitals reviewed.   Lab Results  Component Value Date   WBC 7.7 01/21/2018   HGB 16.7 01/21/2018   HCT 47.6 01/21/2018   PLT 250.0 01/21/2018   GLUCOSE 155 (H) 01/21/2018   CHOL 187 06/13/2016   TRIG 154.0 (H) 06/13/2016   HDL 49.00 06/13/2016   LDLDIRECT 141.0 05/11/2015   LDLCALC 107 (H) 06/13/2016   ALT 50 01/21/2018   AST 41 (H) 01/21/2018   NA 136 01/21/2018   K 4.3 01/21/2018   CL 99 01/21/2018   CREATININE 1.11 01/21/2018   BUN 24 (H) 01/21/2018   CO2 25 01/21/2018   TSH 1.56 01/21/2018   PSA 1.61 01/21/2018   HGBA1C 7.2 (A) 01/21/2018   MICROALBUR 2.8 (H) 01/21/2018    Dg Chest 2 View  Result Date: 04/19/2011 *RADIOLOGY REPORT* Clinical Data: Preop for cervical spine surgery CHEST - 2 VIEW Comparison: None. Findings: Cardiomediastinal silhouette is unremarkable.  Mild thoracic dextroscoliosis.  Mild degenerative changes mid thoracic spine.  No acute infiltrate or pleural effusion.  No pulmonary edema.  Minimal elevation of the right  hemidiaphragm. IMPRESSION: No active disease.  Mild degenerative changes thoracic spine. Original Report Authenticated By: Devon Payne, M.D.   Assessment & Plan:   Devon Payne was seen today for annual exam, hypertension, hyperlipidemia and diabetes.  Diagnoses and all orders for this visit:  Essential hypertension- His blood pressure is well controlled.  Electrolytes and renal function are normal. -     CBC with Differential/Platelet; Future -     Comprehensive metabolic panel; Future -     Urinalysis, Routine w reflex microscopic; Future  Type II diabetes mellitus with manifestations ([place])- His A1c is up to 7.2%.  He is intolerant to metformin.  I have asked him to start using a GLP-1 agonist. -     Comprehensive metabolic panel; Future -     Cancel: Hemoglobin A1c; Future -     Microalbumin / creatinine urine ratio; Future -     POCT glycosylated hemoglobin (Hb A1C) -     POCT glucose (manual entry) -     Dulaglutide (TRULICITY) 3.81 WE/9.9BZ SOPN; Inject 1 Act into the skin once a week. -     HM  Diabetes Foot Exam -     Ambulatory referral to Ophthalmology  Benign prostatic hyperplasia without lower urinary tract symptoms- His PSA is low which is reassuring that he does not have prostate cancer.  He has no symptoms that need to be treated. -     PSA; Future  Hyperlipidemia with target LDL less than 100 -     Lipid panel; Future -     Comprehensive metabolic panel; Future -     TSH; Future  Routine general medical examination at a health care facility  Need for pneumococcal vaccination -     Pneumococcal polysaccharide vaccine 23-valent greater than or equal to 2yo subcutaneous/IM  Elevated LFTs- His ALT and AST are mildly elevated.  I am suspicious that he has fatty liver disease.  I have asked him to undergo a liver ultrasound.  If indeed he does fatty liver disease then will consider starting pioglitazone to reduce long-term complications. -     US Abdomen Limited RUQ;  Future   I am having Zaevion L. Degante start on Dulaglutide. I am also having him maintain his aspirin EC, losartan, pravastatin, and hydrochlorothiazide.  Meds ordered this encounter  Medications  . Dulaglutide (TRULICITY) 4.36 IX/6.5EK SOPN    Sig: Inject 1 Act into the skin once a week.    Dispense:  12 pen    Refill:  1   See AVS for instructions about healthy living and anticipatory guidance.  Follow-up: Return in about 6 months (around 07/23/2018).  Scarlette Calico, MD

## 2018-01-21 NOTE — Patient Instructions (Signed)

## 2018-01-23 NOTE — Assessment & Plan Note (Signed)

## 2018-01-28 ENCOUNTER — Ambulatory Visit
Admission: RE | Admit: 2018-01-28 | Discharge: 2018-01-28 | Disposition: A | Discharge status: Home / Self Care | Payer: Medicare Other | Source: Ambulatory Visit | Attending: Internal Medicine | Admitting: Internal Medicine

## 2018-01-28 ENCOUNTER — Other Ambulatory Visit: Payer: Self-pay | Admitting: Internal Medicine

## 2018-01-28 ENCOUNTER — Encounter: Payer: Self-pay | Admitting: Internal Medicine

## 2018-01-28 DIAGNOSIS — R945 Abnormal results of liver function studies: Secondary | ICD-10-CM | POA: Diagnosis not present

## 2018-01-28 DIAGNOSIS — E118 Type 2 diabetes mellitus with unspecified complications: Secondary | ICD-10-CM

## 2018-01-28 DIAGNOSIS — K760 Fatty (change of) liver, not elsewhere classified: Secondary | ICD-10-CM

## 2018-01-28 DIAGNOSIS — R7989 Other specified abnormal findings of blood chemistry: Secondary | ICD-10-CM

## 2018-01-28 MED ORDER — PIOGLITAZONE HCL 15 MG PO TABS: 15.0000 mg | ORAL_TABLET | Freq: Every day | ORAL | 1 refills | Status: DC

## 2018-02-24 DIAGNOSIS — I10 Essential (primary) hypertension: Secondary | ICD-10-CM | POA: Diagnosis not present

## 2018-02-24 DIAGNOSIS — H66003 Acute suppurative otitis media without spontaneous rupture of ear drum, bilateral: Secondary | ICD-10-CM | POA: Diagnosis not present

## 2018-03-13 ENCOUNTER — Encounter: Payer: Self-pay | Admitting: Internal Medicine

## 2018-03-20 ENCOUNTER — Other Ambulatory Visit: Payer: Self-pay | Admitting: Internal Medicine

## 2018-03-20 DIAGNOSIS — E118 Type 2 diabetes mellitus with unspecified complications: Secondary | ICD-10-CM

## 2018-03-20 DIAGNOSIS — I10 Essential (primary) hypertension: Secondary | ICD-10-CM

## 2018-03-20 DIAGNOSIS — E785 Hyperlipidemia, unspecified: Secondary | ICD-10-CM

## 2018-03-29 ENCOUNTER — Encounter: Payer: Self-pay | Admitting: Internal Medicine

## 2018-03-30 ENCOUNTER — Other Ambulatory Visit: Payer: Self-pay | Admitting: Internal Medicine

## 2018-03-30 DIAGNOSIS — E118 Type 2 diabetes mellitus with unspecified complications: Secondary | ICD-10-CM

## 2018-03-30 MED ORDER — SAXAGLIPTIN HCL 5 MG PO TABS: 5.0000 mg | ORAL_TABLET | Freq: Every day | ORAL | 1 refills | Status: DC

## 2018-03-30 MED ORDER — EXENATIDE ER 2 MG/0.85ML ~~LOC~~ AUIJ: 1.0000 | AUTO-INJECTOR | SUBCUTANEOUS | 1 refills | Status: DC

## 2018-03-31 ENCOUNTER — Encounter: Payer: Self-pay | Admitting: Internal Medicine

## 2018-04-02 ENCOUNTER — Other Ambulatory Visit: Payer: Self-pay | Admitting: Internal Medicine

## 2018-04-02 DIAGNOSIS — E118 Type 2 diabetes mellitus with unspecified complications: Secondary | ICD-10-CM

## 2018-04-02 DIAGNOSIS — K760 Fatty (change of) liver, not elsewhere classified: Secondary | ICD-10-CM

## 2018-04-03 ENCOUNTER — Encounter: Payer: Self-pay | Admitting: Internal Medicine

## 2018-04-04 ENCOUNTER — Other Ambulatory Visit: Payer: Self-pay | Admitting: Internal Medicine

## 2018-04-04 ENCOUNTER — Encounter: Payer: Self-pay | Admitting: Internal Medicine

## 2018-04-04 DIAGNOSIS — E118 Type 2 diabetes mellitus with unspecified complications: Secondary | ICD-10-CM

## 2018-04-04 MED ORDER — SITAGLIP PHOS-METFORMIN HCL ER 100-1000 MG PO TB24: 1.0000 | ORAL_TABLET | Freq: Every day | ORAL | 5 refills | Status: DC

## 2018-04-06 ENCOUNTER — Other Ambulatory Visit: Payer: Self-pay | Admitting: Internal Medicine

## 2018-04-06 DIAGNOSIS — E118 Type 2 diabetes mellitus with unspecified complications: Secondary | ICD-10-CM

## 2018-04-06 MED ORDER — METFORMIN HCL ER 750 MG PO TB24: 750.0000 mg | ORAL_TABLET | Freq: Every day | ORAL | 1 refills | Status: DC

## 2018-04-17 MED ORDER — SITAGLIPTIN PHOS-METFORMIN HCL 50-1000 MG PO TABS: 1.0000 | ORAL_TABLET | Freq: Two times a day (BID) | ORAL | 3 refills | Status: DC

## 2018-04-17 NOTE — Telephone Encounter (Signed)
Received patient assistance forms.   Spoke to pt to confirm patient assistance for Janumet [id number] mg tablet. rx printed and form completed for PCP to sign.

## 2018-04-20 ENCOUNTER — Encounter: Payer: Self-pay | Admitting: Internal Medicine

## 2018-04-20 NOTE — Telephone Encounter (Signed)
Pt states that he never received a page 1. Pt also doesn't know where the form needs to go and not sure where to take if he was to pick the form up in office.

## 2018-04-20 NOTE — Telephone Encounter (Signed)
LVM to inform patient the forms have been completed and we needed to know if he was going to pick them up or knew where they needed to be sent. It looks as if page 1 or the 3 is missing.   Copy sent to scan.

## 2018-04-20 NOTE — Telephone Encounter (Signed)
Please advise 

## 2018-04-21 MED ORDER — SITAGLIP PHOS-METFORMIN HCL ER 100-1000 MG PO TB24: 1.0000 | ORAL_TABLET | Freq: Every day | ORAL | 3 refills | Status: DC

## 2018-04-30 ENCOUNTER — Encounter: Payer: Self-pay | Admitting: Registered"

## 2018-04-30 ENCOUNTER — Encounter: Payer: Medicare Other | Attending: Internal Medicine | Admitting: Registered"

## 2018-04-30 DIAGNOSIS — E119 Type 2 diabetes mellitus without complications: Secondary | ICD-10-CM | POA: Diagnosis not present

## 2018-04-30 NOTE — Patient Instructions (Signed)
Goals:  Follow Diabetes Meal Plan as instructed  Eat 3 meals and 2 snacks, every 3-5 hrs  Limit carbohydrate intake to 45-60 grams carbohydrate/meal  Limit carbohydrate intake to 15-30 grams carbohydrate/snack  Add lean protein foods to meals/snacks  Monitor glucose levels as instructed by your doctor  Aim for 30 mins of physical activity daily  Bring food record and glucose log to your next nutrition visit 

## 2018-04-30 NOTE — Progress Notes (Signed)
Diabetes Self-Management Education  Visit Type:  First/Initial  Appt. Start Time: 9:30  Appt. End Time: 10:26  04/30/2018  [name], identified by name and date of birth, is a 68 y.o. male with a diagnosis of Diabetes: Type 2.   ASSESSMENT  Pt expectations: tools, learn to eat better; ultimately wants to get off medications  Pt states his diabetes medications are expensive and wants to manage diabetes without medications. Pt reports his dad had diabetes and he does not want to repeat what happened to him.   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 Pt states he typically eats breakfast after work on those days. Pt states he likes cooking and eating vegetables. Pt reports eating out can be challenging due to portion sizes offered.    There were no vitals taken for this visit. There is no height or weight on file to calculate BMI.   Diabetes Self-Management Education - 04/30/18 0937      Health Coping   How would you rate your overall health?  Good      Psychosocial Assessment   Patient Belief/Attitude about Diabetes  Motivated to manage diabetes    Self-care barriers  None    Self-management support  Friends;Doctor's office    Patient Concerns  Nutrition/Meal planning    Special Needs  None    Preferred Learning Style  No preference indicated    Learning Readiness  Ready      Complications   Last HgB A1C per patient/outside source  7.1 %    How often do you check your blood sugar?  0 times/day (not testing)    Number of hypoglycemic episodes per month  0    Number of hyperglycemic episodes per week  0    Have you had a dilated eye exam in the past 12 months?  Yes    Have you had a dental exam in the past 12 months?  No    Are you checking your feet?  Yes    How many days per week are you checking your feet?  7      Dietary Intake   Breakfast  boiled egg + whole wheat toast + skim milk     Snack (morning)  none    Lunch  baked/broiled  chicken breast + steamed vegetable    Snack (afternoon)  celery + peanut butter or baked crackers    Dinner  fish + vegetable/salad      Snack (evening)  sometimes celery + peanut butter or baked crackers    Beverage(s)  skim milk, water, diet coke      Exercise   Exercise Type  Light (walking / raking leaves)    How many days per week to you exercise?  3    How many minutes per day do you exercise?  30    Total minutes per week of exercise  90      Patient Education   Previous Diabetes Education  No    Disease state   Definition of diabetes, type 1 and 2, and the diagnosis of diabetes;Factors that contribute to the development of diabetes    Nutrition management   Carbohydrate counting;Role of diet in the treatment of diabetes and the relationship between the three main macronutrients and blood glucose level;Food label reading, portion sizes and measuring food.;Reviewed blood glucose goals for pre and post meals and how to evaluate the patients' food intake on their blood glucose level.;Information on  hints to eating out and maintain blood glucose control.    Physical activity and exercise   Role of exercise on diabetes management, blood pressure control and cardiac health.    Monitoring  Purpose and frequency of SMBG.;Interpreting lab values - A1C, lipid, urine microalbumina.;Identified appropriate SMBG and/or A1C goals.;Yearly dilated eye exam;Daily foot exams    Acute complications  Taught treatment of hypoglycemia - the 15 rule.;Discussed and identified patients' treatment of hyperglycemia.    Chronic complications  Relationship between chronic complications and blood glucose control;Assessed and discussed foot care and prevention of foot problems;Lipid levels, blood glucose control and heart disease;Dental care;Identified and discussed with patient  current chronic complications;Retinopathy and reason for yearly dilated eye exams;Reviewed with patient heart disease, higher risk of, and  prevention;Nephropathy, what it is, prevention of, the use of ACE, ARB's and early detection of through urine microalbumia.    Psychosocial adjustment  Role of stress on diabetes;Identified and addressed patients feelings and concerns about diabetes;Helped patient identify a support system for diabetes management      Individualized Goals (developed by patient)   Nutrition  General guidelines for healthy choices and portions discussed    Physical Activity  Exercise 3-5 times per week;30 minutes per day    Medications  take my medication as prescribed    Monitoring   Not Applicable    Reducing Risk  do foot checks daily;increase portions of olive oil in diet;treat hypoglycemia with 15 grams of carbs if blood glucose less than 70mg /dL    Health Coping  Not Applicable      Post-Education Assessment   Patient understands the diabetes disease and treatment process.  Demonstrates understanding / competency    Patient understands incorporating nutritional management into lifestyle.  Demonstrates understanding / competency    Patient undertands incorporating physical activity into lifestyle.  Demonstrates understanding / competency    Patient understands using medications safely.  Demonstrates understanding / competency    Patient understands monitoring blood glucose, interpreting and using results  Demonstrates understanding / competency    Patient understands prevention, detection, and treatment of acute complications.  Demonstrates understanding / competency    Patient understands prevention, detection, and treatment of chronic complications.  Demonstrates understanding / competency    Patient understands how to develop strategies to address psychosocial issues.  Demonstrates understanding / competency    Patient understands how to develop strategies to promote health/change behavior.  Demonstrates understanding / competency      Outcomes   Program Status  Completed       Learning Objective:   Patient will have a greater understanding of diabetes self-management. Patient education plan is to attend individual and/or group sessions per assessed needs and concerns.   Plan:   Patient Instructions  Goals:  Follow Diabetes Meal Plan as instructed  Eat 3 meals and 2 snacks, every 3-5 hrs  Limit carbohydrate intake to 45-60 grams carbohydrate/meal  Limit carbohydrate intake to 15-30 grams carbohydrate/snack  Add lean protein foods to meals/snacks  Monitor glucose levels as instructed by your doctor  Aim for 30 mins of physical activity daily  Bring food record and glucose log to your next nutrition visit     Expected Outcomes:  Demonstrated interest in learning. Expect positive outcomes  Education material provided: ADA Diabetes: Your Take Control Guide and Carbohydrate counting sheet  If problems or questions, patient to contact team via:  Phone and Email  Future DSME appointment: - Yearly

## 2018-05-05 ENCOUNTER — Encounter: Payer: Self-pay | Admitting: Internal Medicine

## 2018-05-18 ENCOUNTER — Encounter: Payer: Self-pay | Admitting: Internal Medicine

## 2018-06-02 ENCOUNTER — Encounter: Payer: Self-pay | Admitting: Internal Medicine

## 2018-06-18 ENCOUNTER — Other Ambulatory Visit: Payer: Self-pay | Admitting: Internal Medicine

## 2018-06-18 DIAGNOSIS — I10 Essential (primary) hypertension: Secondary | ICD-10-CM

## 2018-06-18 DIAGNOSIS — E118 Type 2 diabetes mellitus with unspecified complications: Secondary | ICD-10-CM

## 2018-06-19 ENCOUNTER — Other Ambulatory Visit: Payer: Self-pay | Admitting: Internal Medicine

## 2018-06-19 DIAGNOSIS — E785 Hyperlipidemia, unspecified: Secondary | ICD-10-CM

## 2018-06-19 DIAGNOSIS — E118 Type 2 diabetes mellitus with unspecified complications: Secondary | ICD-10-CM

## 2018-06-19 DIAGNOSIS — I10 Essential (primary) hypertension: Secondary | ICD-10-CM

## 2018-06-19 MED ORDER — PRAVASTATIN SODIUM 20 MG PO TABS: ORAL_TABLET | ORAL | 0 refills | Status: DC

## 2018-06-19 MED ORDER — HYDROCHLOROTHIAZIDE 25 MG PO TABS: ORAL_TABLET | ORAL | 0 refills | Status: DC

## 2018-09-02 ENCOUNTER — Other Ambulatory Visit: Payer: Self-pay | Admitting: Internal Medicine

## 2018-09-02 DIAGNOSIS — E118 Type 2 diabetes mellitus with unspecified complications: Secondary | ICD-10-CM

## 2018-09-02 DIAGNOSIS — K760 Fatty (change of) liver, not elsewhere classified: Secondary | ICD-10-CM

## 2018-09-07 ENCOUNTER — Encounter: Payer: Self-pay | Admitting: Internal Medicine

## 2018-09-07 ENCOUNTER — Other Ambulatory Visit: Payer: Self-pay | Admitting: Internal Medicine

## 2018-09-07 DIAGNOSIS — K760 Fatty (change of) liver, not elsewhere classified: Secondary | ICD-10-CM

## 2018-09-07 DIAGNOSIS — E118 Type 2 diabetes mellitus with unspecified complications: Secondary | ICD-10-CM

## 2018-09-07 MED ORDER — PIOGLITAZONE HCL 15 MG PO TABS: 15.0000 mg | ORAL_TABLET | Freq: Every day | ORAL | 0 refills | Status: DC

## 2018-09-15 ENCOUNTER — Other Ambulatory Visit: Payer: Self-pay | Admitting: Internal Medicine

## 2018-09-15 DIAGNOSIS — I10 Essential (primary) hypertension: Secondary | ICD-10-CM

## 2018-09-15 MED ORDER — HYDROCHLOROTHIAZIDE 25 MG PO TABS: ORAL_TABLET | ORAL | 0 refills | Status: DC

## 2018-09-21 ENCOUNTER — Other Ambulatory Visit: Payer: Self-pay | Admitting: Internal Medicine

## 2018-09-21 DIAGNOSIS — E118 Type 2 diabetes mellitus with unspecified complications: Secondary | ICD-10-CM

## 2018-09-21 DIAGNOSIS — E785 Hyperlipidemia, unspecified: Secondary | ICD-10-CM

## 2018-09-21 MED ORDER — PRAVASTATIN SODIUM 20 MG PO TABS: ORAL_TABLET | ORAL | 0 refills | Status: DC

## 2018-09-22 ENCOUNTER — Other Ambulatory Visit: Payer: Self-pay

## 2018-09-22 ENCOUNTER — Encounter: Payer: Self-pay | Admitting: Internal Medicine

## 2018-09-22 ENCOUNTER — Ambulatory Visit (INDEPENDENT_AMBULATORY_CARE_PROVIDER_SITE_OTHER): Payer: Medicare Other | Admitting: Internal Medicine

## 2018-09-22 ENCOUNTER — Other Ambulatory Visit (INDEPENDENT_AMBULATORY_CARE_PROVIDER_SITE_OTHER): Payer: Medicare Other

## 2018-09-22 VITALS — BP 136/84 | HR 73 | Temp 98.2°F | Resp 16 | Ht 66.75 in | Wt 227.0 lb

## 2018-09-22 DIAGNOSIS — R7989 Other specified abnormal findings of blood chemistry: Secondary | ICD-10-CM

## 2018-09-22 DIAGNOSIS — K760 Fatty (change of) liver, not elsewhere classified: Secondary | ICD-10-CM

## 2018-09-22 DIAGNOSIS — R945 Abnormal results of liver function studies: Secondary | ICD-10-CM

## 2018-09-22 DIAGNOSIS — I10 Essential (primary) hypertension: Secondary | ICD-10-CM | POA: Diagnosis not present

## 2018-09-22 DIAGNOSIS — E118 Type 2 diabetes mellitus with unspecified complications: Secondary | ICD-10-CM

## 2018-09-22 DIAGNOSIS — E781 Pure hyperglyceridemia: Secondary | ICD-10-CM

## 2018-09-22 DIAGNOSIS — K7581 Nonalcoholic steatohepatitis (NASH): Secondary | ICD-10-CM | POA: Insufficient documentation

## 2018-09-22 LAB — HEPATIC FUNCTION PANEL
ALT: 23 U/L (ref 0–53)
AST: 22 U/L (ref 0–37)
Albumin: 4.6 g/dL (ref 3.5–5.2)
Alkaline Phosphatase: 34 U/L — ABNORMAL LOW (ref 39–117)
Bilirubin, Direct: 0.1 mg/dL (ref 0.0–0.3)
Total Bilirubin: 0.7 mg/dL (ref 0.2–1.2)
Total Protein: 7.4 g/dL (ref 6.0–8.3)

## 2018-09-22 LAB — BASIC METABOLIC PANEL
BUN: 25 mg/dL — ABNORMAL HIGH (ref 6–23)
CO2: 24 mEq/L (ref 19–32)
Calcium: 9.2 mg/dL (ref 8.4–10.5)
Chloride: 101 mEq/L (ref 96–112)
Creatinine, Ser: 1 mg/dL (ref 0.40–1.50)
GFR: 74.22 mL/min (ref >60.00)
Glucose, Bld: 118 mg/dL — ABNORMAL HIGH (ref 70–99)
Potassium: 4.1 mEq/L (ref 3.5–5.1)
Sodium: 136 mEq/L (ref 135–145)

## 2018-09-22 LAB — HEMOGLOBIN A1C: Hgb A1c MFr Bld: 6.3 % (ref 4.6–6.5)

## 2018-09-22 LAB — TRIGLYCERIDES: Triglycerides: 125 mg/dL (ref 0.0–149.0)

## 2018-09-22 NOTE — Patient Instructions (Signed)
Type 2 Diabetes Mellitus, Diagnosis, Adult Type 2 diabetes (type 2 diabetes mellitus) is a long-term (chronic) disease. In type 2 diabetes, one or both of these problems may be present:  The pancreas does not make enough of a hormone called insulin.  Cells in the body do not respond properly to insulin that the body makes (insulin resistance). Normally, insulin allows blood sugar (glucose) to enter cells in the body. The cells use glucose for energy. Insulin resistance or lack of insulin causes excess glucose to build up in the blood instead of going into cells. As a result, high blood glucose (hyperglycemia) develops. What increases the risk? The following factors may make you more likely to develop type 2 diabetes:  Having a family member with type 2 diabetes.  Being overweight or obese.  Having an inactive (sedentary) lifestyle.  Having been diagnosed with insulin resistance.  Having a history of prediabetes, gestational diabetes, or polycystic ovary syndrome (PCOS).  Being of American-Indian, African-American, Hispanic/Latino, or Asian/Pacific Islander descent. What are the signs or symptoms? In the early stage of this condition, you may not have symptoms. Symptoms develop slowly and may include:  Increased thirst (polydipsia).  Increased hunger(polyphagia).  Increased urination (polyuria).  Increased urination during the night (nocturia).  Unexplained weight loss.  Frequent infections that keep coming back (recurring).  Fatigue.  Weakness.  Vision changes, such as blurry vision.  Cuts or bruises that are slow to heal.  Tingling or numbness in the hands or feet.  Dark patches on the skin (acanthosis nigricans). How is this diagnosed? This condition is diagnosed based on your symptoms, your medical history, a physical exam, and your blood glucose level. Your blood glucose may be checked with one or more of the following blood tests:  A fasting blood glucose (FBG)  test. You will not be allowed to eat (you will fast) for 8 hours or longer before a blood sample is taken.  A random blood glucose test. This test checks blood glucose at any time of day regardless of when you ate.  An A1c (hemoglobin A1c) blood test. This test provides information about blood glucose control over the previous 2-3 months.  An oral glucose tolerance test (OGTT). This test measures your blood glucose at two times: ? After fasting. This is your baseline blood glucose level. ? Two hours after drinking a beverage that contains glucose. You may be diagnosed with type 2 diabetes if:  Your FBG level is 126 mg/dL (7.0 mmol/L) or higher.  Your random blood glucose level is 200 mg/dL (11.1 mmol/L) or higher.  Your A1c level is 6.5% or higher.  Your OGTT result is higher than 200 mg/dL (11.1 mmol/L). These blood tests may be repeated to confirm your diagnosis. How is this treated? Your treatment may be managed by a specialist called an endocrinologist. Type 2 diabetes may be treated by following instructions from your health care provider about:  Making diet and lifestyle changes. This may include: ? Following an individualized nutrition plan that is developed by a diet and nutrition specialist (registered dietitian). ? Exercising regularly. ? Finding ways to manage stress.  Checking your blood glucose level as often as told.  Taking diabetes medicines or insulin daily. This helps to keep your blood glucose levels in the healthy range. ? If you use insulin, you may need to adjust the dosage depending on how physically active you are and what foods you eat. Your health care provider will tell you how to adjust your dosage.    Taking medicines to help prevent complications from diabetes, such as: ? Aspirin. ? Medicine to lower cholesterol. ? Medicine to control blood pressure. Your health care provider will set individualized treatment goals for you. Your goals will be based on  your age, other medical conditions you have, and how you respond to diabetes treatment. Generally, the goal of treatment is to maintain the following blood glucose levels:  Before meals (preprandial): 80-130 mg/dL (4.4-7.2 mmol/L).  After meals (postprandial): below 180 mg/dL (10 mmol/L).  A1c level: less than 7%. Follow these instructions at home: Questions to ask your health care provider  Consider asking the following questions: ? Do I need to meet with a diabetes educator? ? Where can I find a support group for people with diabetes? ? What equipment will I need to manage my diabetes at home? ? What diabetes medicines do I need, and when should I take them? ? How often do I need to check my blood glucose? ? What number can I call if I have questions? ? When is my next appointment? General instructions  Take over-the-counter and prescription medicines only as told by your health care provider.  Keep all follow-up visits as told by your health care provider. This is important.  For more information about diabetes, visit: ? American Diabetes Association (ADA): www.diabetes.org ? American Association of Diabetes Educators (AADE): www.diabeteseducator.org Contact a health care provider if:  Your blood glucose is at or above 240 mg/dL (13.3 mmol/L) for 2 days in a row.  You have been sick or have had a fever for 2 days or longer, and you are not getting better.  You have any of the following problems for more than 6 hours: ? You cannot eat or drink. ? You have nausea and vomiting. ? You have diarrhea. Get help right away if:  Your blood glucose is lower than 54 mg/dL (3.0 mmol/L).  You become confused or you have trouble thinking clearly.  You have difficulty breathing.  You have moderate or large ketone levels in your urine. Summary  Type 2 diabetes (type 2 diabetes mellitus) is a long-term (chronic) disease. In type 2 diabetes, the pancreas does not make enough of a  hormone called insulin, or cells in the body do not respond properly to insulin that the body makes (insulin resistance).  This condition is treated by making diet and lifestyle changes and taking diabetes medicines or insulin.  Your health care provider will set individualized treatment goals for you. Your goals will be based on your age, other medical conditions you have, and how you respond to diabetes treatment.  Keep all follow-up visits as told by your health care provider. This is important. This information is not intended to replace advice given to you by your health care provider. Make sure you discuss any questions you have with your health care provider. Document Released: 03/18/2005 Document Revised: 10/17/2016 Document Reviewed: 04/21/2015 Elsevier Interactive Patient Education  2019 [employer]  

## 2018-09-22 NOTE — Progress Notes (Signed)
Subjective:  Patient ID: Devon Payne, male    DOB: Jan 31, 1951  Age: 68 y.o. MRN: [id number]  CC: Hypertension, Hyperlipidemia, and Diabetes   HPI Devon Payne presents for f/up - He has been working on his lifestyle modifications and tells me his blood pressure and blood sugars have been well controlled.  He feels well today and offers no complaints.  Outpatient Medications Prior to Visit  Medication Sig Dispense Refill  . aspirin EC 81 MG tablet Take 1 tablet (81 mg total) by mouth daily. 90 tablet 3  . hydrochlorothiazide (HYDRODIURIL) 25 MG tablet TAKE 1 TABLET(25 MG) BY MOUTH DAILY 30 tablet 0  . losartan (COZAAR) 100 MG tablet TAKE 1 TABLET BY MOUTH EVERY DAY 90 tablet 1  . pioglitazone (ACTOS) 15 MG tablet Take 1 tablet (15 mg total) by mouth daily. 30 tablet 0  . pravastatin (PRAVACHOL) 20 MG tablet TAKE 1 TABLET(20 MG) BY MOUTH DAILY 90 tablet 0  . SitaGLIPtin-MetFORMIN HCl (JANUMET XR) [phone] MG TB24 Take 1 tablet by mouth daily. 90 tablet 3  . metFORMIN (GLUCOPHAGE-XR) 750 MG 24 hr tablet Take 1 tablet (750 mg total) by mouth daily with breakfast. 90 tablet 1   No facility-administered medications prior to visit.     ROS Review of Systems  Constitutional: Negative for diaphoresis, fatigue and unexpected weight change.  HENT: Negative.   Eyes: Negative for visual disturbance.  Respiratory: Negative for cough, chest tightness, shortness of breath and wheezing.   Cardiovascular: Negative for chest pain, palpitations and leg swelling.  Gastrointestinal: Negative for abdominal pain, constipation, diarrhea, nausea and vomiting.  Endocrine: Negative.   Genitourinary: Negative.  Negative for difficulty urinating.  Musculoskeletal: Negative.  Negative for arthralgias and myalgias.  Skin: Negative.  Negative for color change and pallor.  Neurological: Negative.  Negative for dizziness, weakness and light-headedness.  Hematological: Negative for adenopathy. Does not  bruise/bleed easily.  Psychiatric/Behavioral: Negative.     Objective:  BP 136/84 (BP Location: Left Arm, Patient Position: Sitting, Cuff Size: Large)   Pulse 73   Temp 98.2 F (36.8 C) (Oral)   Resp 16   Ht 5' 6.75" (1.695 m)   Wt 227 lb (103 kg)   SpO2 97%   BMI 35.82 kg/m   BP Readings from Last 3 Encounters:  09/22/18 136/84  01/21/18 110/80  06/13/16 138/90    Wt Readings from Last 3 Encounters:  09/22/18 227 lb (103 kg)  01/21/18 227 lb (103 kg)  06/13/16 230 lb (104.3 kg)    Physical Exam Vitals signs reviewed.  Constitutional:      Appearance: He is obese. He is not ill-appearing or diaphoretic.  HENT:     Nose: Nose normal.     Mouth/Throat:     Mouth: Mucous membranes are moist.  Eyes:     General: No scleral icterus.    Conjunctiva/sclera: Conjunctivae normal.  Neck:     Musculoskeletal: Normal range of motion. No neck rigidity or muscular tenderness.  Cardiovascular:     Rate and Rhythm: Normal rate and regular rhythm.     Heart sounds: No murmur. No gallop.      Comments: EKG ---  Sinus  Rhythm  WITHIN NORMAL LIMITS  Pulmonary:     Effort: Pulmonary effort is normal.     Breath sounds: No stridor. No wheezing, rhonchi or rales.  Abdominal:     General: Abdomen is protuberant. Bowel sounds are normal. There is no distension.     Palpations:  Abdomen is soft. There is no hepatomegaly or splenomegaly.     Tenderness: There is no abdominal tenderness.  Musculoskeletal:     Right lower leg: No edema.     Left lower leg: No edema.  Lymphadenopathy:     Cervical: No cervical adenopathy.  Skin:    General: Skin is warm.  Neurological:     General: No focal deficit present.     Mental Status: Mental status is at baseline.  Psychiatric:        Mood and Affect: Mood normal.        Behavior: Behavior normal.     Lab Results  Component Value Date   WBC 7.7 01/21/2018   HGB 16.7 01/21/2018   HCT 47.6 01/21/2018   PLT 250.0 01/21/2018    GLUCOSE 118 (H) 09/22/2018   CHOL 187 06/13/2016   TRIG 125.0 09/22/2018   HDL 49.00 06/13/2016   LDLDIRECT 141.0 05/11/2015   LDLCALC 107 (H) 06/13/2016   ALT 23 09/22/2018   AST 22 09/22/2018   NA 136 09/22/2018   K 4.1 09/22/2018   CL 101 09/22/2018   CREATININE 1.00 09/22/2018   BUN 25 (H) 09/22/2018   CO2 24 09/22/2018   TSH 1.56 01/21/2018   PSA 1.61 01/21/2018   HGBA1C 6.3 09/22/2018   MICROALBUR 2.8 (H) 01/21/2018    US Abdomen Limited Ruq  Result Date: 01/28/2018 CLINICAL DATA:  Elevated liver enzymes EXAM: ULTRASOUND ABDOMEN LIMITED RIGHT UPPER QUADRANT COMPARISON:  None. FINDINGS: Gallbladder: No gallstones or wall thickening visualized. There is no pericholecystic fluid. No sonographic Murphy sign noted by sonographer. Common bile duct: Diameter: 3 mm. No intrahepatic or extrahepatic biliary duct dilatation. Liver: No focal lesion identified. Liver echogenicity is increased diffusely. Portal vein is patent on color Doppler imaging with normal direction of blood flow towards the liver. IMPRESSION: Diffuse increase in liver echogenicity, a finding felt to be indicative of hepatic steatosis. While no focal liver lesions are evident, it must be cautioned that the sensitivity of ultrasound for detection of focal liver lesions is diminished in this circumstance. Study otherwise unremarkable. Electronically Signed   By: Lowella Grip III M.D.   On: 01/28/2018 11:42    Assessment & Plan:   Ezekiel was seen today for hypertension, hyperlipidemia and diabetes.  Diagnoses and all orders for this visit:  Type II diabetes mellitus with manifestations ([place])- His A1c is at 6.3%.  He has good glycemic control. -     Basic metabolic panel; Future -     Hemoglobin A1c; Future -     Ambulatory referral to Ophthalmology  Essential hypertension- His blood pressure is well controlled.  EKG is negative for LVH or ischemia. -     Basic metabolic panel; Future -     EKG 12-Lead  Fatty  liver disease, nonalcoholic- His LFTs are normal now.  Will continue the current dose of pioglitazone. -     Hepatic function panel; Future -     Hepatitis B surface antigen; Future -     Hepatitis A antibody, total; Future -     Hepatitis B core antibody, total; Future -     Hepatitis B surface antibody,qualitative; Future  Elevated LFTs -     Hepatic function panel; Future -     Hepatitis B surface antigen; Future -     Hepatitis A antibody, total; Future -     Hepatitis B core antibody, total; Future -     Hepatitis B surface antibody,qualitative;  Future  Primary hypertriglyceridemia- Improvement noted. -     Triglycerides; Future   I have discontinued Dominica Severin L. Swaney's metFORMIN. I am also having him maintain his aspirin EC, SitaGLIPtin-MetFORMIN HCl, losartan, pioglitazone, hydrochlorothiazide, and pravastatin.  No orders of the defined types were placed in this encounter.    Follow-up: Return in about 6 months (around 03/24/2019).  Scarlette Calico, MD

## 2018-09-23 ENCOUNTER — Encounter: Payer: Self-pay | Admitting: Internal Medicine

## 2018-09-23 LAB — HEPATITIS B SURFACE ANTIBODY,QUALITATIVE: Hep B S Ab: NONREACTIVE

## 2018-09-23 LAB — HEPATITIS B SURFACE ANTIGEN: Hepatitis B Surface Ag: NONREACTIVE

## 2018-09-23 LAB — HEPATITIS B CORE ANTIBODY, TOTAL: Hep B Core Total Ab: NONREACTIVE

## 2018-09-23 LAB — HEPATITIS A ANTIBODY, TOTAL: Hepatitis A AB,Total: NONREACTIVE

## 2018-09-28 ENCOUNTER — Ambulatory Visit (INDEPENDENT_AMBULATORY_CARE_PROVIDER_SITE_OTHER): Payer: Medicare Other

## 2018-09-28 DIAGNOSIS — Z299 Encounter for prophylactic measures, unspecified: Secondary | ICD-10-CM

## 2018-09-28 DIAGNOSIS — Z23 Encounter for immunization: Secondary | ICD-10-CM | POA: Diagnosis not present

## 2018-09-29 ENCOUNTER — Other Ambulatory Visit: Payer: Self-pay | Admitting: Internal Medicine

## 2018-09-29 DIAGNOSIS — K760 Fatty (change of) liver, not elsewhere classified: Secondary | ICD-10-CM

## 2018-09-29 DIAGNOSIS — E118 Type 2 diabetes mellitus with unspecified complications: Secondary | ICD-10-CM

## 2018-10-07 ENCOUNTER — Other Ambulatory Visit: Payer: Self-pay | Admitting: Internal Medicine

## 2018-10-07 DIAGNOSIS — I10 Essential (primary) hypertension: Secondary | ICD-10-CM

## 2018-10-07 DIAGNOSIS — K760 Fatty (change of) liver, not elsewhere classified: Secondary | ICD-10-CM

## 2018-10-07 DIAGNOSIS — E118 Type 2 diabetes mellitus with unspecified complications: Secondary | ICD-10-CM

## 2018-10-07 DIAGNOSIS — E785 Hyperlipidemia, unspecified: Secondary | ICD-10-CM

## 2018-10-15 ENCOUNTER — Other Ambulatory Visit: Payer: Self-pay | Admitting: Internal Medicine

## 2018-10-15 DIAGNOSIS — I10 Essential (primary) hypertension: Secondary | ICD-10-CM

## 2018-10-15 MED ORDER — HYDROCHLOROTHIAZIDE 25 MG PO TABS: ORAL_TABLET | ORAL | 1 refills | Status: DC

## 2018-10-28 DIAGNOSIS — H40053 Ocular hypertension, bilateral: Secondary | ICD-10-CM | POA: Diagnosis not present

## 2018-10-28 DIAGNOSIS — E1165 Type 2 diabetes mellitus with hyperglycemia: Secondary | ICD-10-CM | POA: Diagnosis not present

## 2018-10-28 DIAGNOSIS — D3131 Benign neoplasm of right choroid: Secondary | ICD-10-CM | POA: Diagnosis not present

## 2018-10-28 DIAGNOSIS — H359 Unspecified retinal disorder: Secondary | ICD-10-CM | POA: Diagnosis not present

## 2018-10-28 LAB — HM DIABETES EYE EXAM

## 2018-11-19 ENCOUNTER — Encounter: Payer: Self-pay | Admitting: Internal Medicine

## 2018-11-25 ENCOUNTER — Ambulatory Visit (INDEPENDENT_AMBULATORY_CARE_PROVIDER_SITE_OTHER): Payer: Medicare Other

## 2018-11-25 DIAGNOSIS — Z299 Encounter for prophylactic measures, unspecified: Secondary | ICD-10-CM

## 2018-11-25 DIAGNOSIS — Z23 Encounter for immunization: Secondary | ICD-10-CM | POA: Diagnosis not present

## 2018-12-18 ENCOUNTER — Other Ambulatory Visit: Payer: Self-pay | Admitting: Internal Medicine

## 2018-12-18 DIAGNOSIS — E785 Hyperlipidemia, unspecified: Secondary | ICD-10-CM

## 2018-12-18 DIAGNOSIS — E118 Type 2 diabetes mellitus with unspecified complications: Secondary | ICD-10-CM

## 2018-12-18 MED ORDER — PRAVASTATIN SODIUM 20 MG PO TABS: ORAL_TABLET | ORAL | 1 refills | Status: DC

## 2018-12-21 ENCOUNTER — Other Ambulatory Visit: Payer: Self-pay | Admitting: Internal Medicine

## 2018-12-21 DIAGNOSIS — E118 Type 2 diabetes mellitus with unspecified complications: Secondary | ICD-10-CM

## 2018-12-21 DIAGNOSIS — I10 Essential (primary) hypertension: Secondary | ICD-10-CM

## 2018-12-21 DIAGNOSIS — K760 Fatty (change of) liver, not elsewhere classified: Secondary | ICD-10-CM

## 2018-12-21 DIAGNOSIS — M47816 Spondylosis without myelopathy or radiculopathy, lumbar region: Secondary | ICD-10-CM | POA: Diagnosis not present

## 2018-12-21 DIAGNOSIS — M5136 Other intervertebral disc degeneration, lumbar region: Secondary | ICD-10-CM | POA: Diagnosis not present

## 2018-12-21 DIAGNOSIS — M5416 Radiculopathy, lumbar region: Secondary | ICD-10-CM | POA: Diagnosis not present

## 2019-01-18 DIAGNOSIS — M5416 Radiculopathy, lumbar region: Secondary | ICD-10-CM | POA: Diagnosis not present

## 2019-01-25 DIAGNOSIS — M5416 Radiculopathy, lumbar region: Secondary | ICD-10-CM | POA: Diagnosis not present

## 2019-01-25 DIAGNOSIS — M5136 Other intervertebral disc degeneration, lumbar region: Secondary | ICD-10-CM | POA: Diagnosis not present

## 2019-01-25 DIAGNOSIS — M47816 Spondylosis without myelopathy or radiculopathy, lumbar region: Secondary | ICD-10-CM | POA: Diagnosis not present

## 2019-02-08 DIAGNOSIS — M5126 Other intervertebral disc displacement, lumbar region: Secondary | ICD-10-CM | POA: Diagnosis not present

## 2019-02-08 DIAGNOSIS — M5416 Radiculopathy, lumbar region: Secondary | ICD-10-CM | POA: Diagnosis not present

## 2019-02-08 DIAGNOSIS — M48061 Spinal stenosis, lumbar region without neurogenic claudication: Secondary | ICD-10-CM | POA: Diagnosis not present

## 2019-02-17 ENCOUNTER — Other Ambulatory Visit: Payer: Self-pay

## 2019-02-17 ENCOUNTER — Ambulatory Visit (INDEPENDENT_AMBULATORY_CARE_PROVIDER_SITE_OTHER): Payer: Medicare Other

## 2019-02-17 DIAGNOSIS — Z23 Encounter for immunization: Secondary | ICD-10-CM | POA: Diagnosis not present

## 2019-02-17 DIAGNOSIS — Z299 Encounter for prophylactic measures, unspecified: Secondary | ICD-10-CM

## 2019-02-22 DIAGNOSIS — M4726 Other spondylosis with radiculopathy, lumbar region: Secondary | ICD-10-CM | POA: Diagnosis not present

## 2019-02-22 DIAGNOSIS — R937 Abnormal findings on diagnostic imaging of other parts of musculoskeletal system: Secondary | ICD-10-CM | POA: Diagnosis not present

## 2019-02-24 DIAGNOSIS — C720 Malignant neoplasm of spinal cord: Secondary | ICD-10-CM | POA: Diagnosis not present

## 2019-02-24 DIAGNOSIS — M5136 Other intervertebral disc degeneration, lumbar region: Secondary | ICD-10-CM | POA: Diagnosis not present

## 2019-02-24 DIAGNOSIS — I10 Essential (primary) hypertension: Secondary | ICD-10-CM | POA: Diagnosis not present

## 2019-02-24 DIAGNOSIS — M47816 Spondylosis without myelopathy or radiculopathy, lumbar region: Secondary | ICD-10-CM | POA: Diagnosis not present

## 2019-02-24 DIAGNOSIS — E119 Type 2 diabetes mellitus without complications: Secondary | ICD-10-CM | POA: Diagnosis not present

## 2019-03-04 ENCOUNTER — Other Ambulatory Visit: Payer: Self-pay | Admitting: Radiation Therapy

## 2019-03-04 DIAGNOSIS — G9589 Other specified diseases of spinal cord: Secondary | ICD-10-CM | POA: Diagnosis not present

## 2019-03-08 ENCOUNTER — Inpatient Hospital Stay: Payer: Medicare Other

## 2019-03-08 ENCOUNTER — Other Ambulatory Visit: Payer: Self-pay | Admitting: Neurosurgery

## 2019-03-08 DIAGNOSIS — G9589 Other specified diseases of spinal cord: Secondary | ICD-10-CM

## 2019-03-08 DIAGNOSIS — D492 Neoplasm of unspecified behavior of bone, soft tissue, and skin: Secondary | ICD-10-CM | POA: Insufficient documentation

## 2019-03-08 DIAGNOSIS — C7949 Secondary malignant neoplasm of other parts of nervous system: Secondary | ICD-10-CM

## 2019-03-08 DIAGNOSIS — C720 Malignant neoplasm of spinal cord: Secondary | ICD-10-CM

## 2019-03-08 DIAGNOSIS — Z803 Family history of malignant neoplasm of breast: Secondary | ICD-10-CM | POA: Insufficient documentation

## 2019-03-09 ENCOUNTER — Inpatient Hospital Stay: Payer: Medicare Other | Attending: Internal Medicine | Admitting: Internal Medicine

## 2019-03-09 ENCOUNTER — Other Ambulatory Visit: Payer: Self-pay

## 2019-03-09 DIAGNOSIS — D492 Neoplasm of unspecified behavior of bone, soft tissue, and skin: Secondary | ICD-10-CM | POA: Insufficient documentation

## 2019-03-09 DIAGNOSIS — Z803 Family history of malignant neoplasm of breast: Secondary | ICD-10-CM

## 2019-03-09 NOTE — Progress Notes (Signed)
[facility] at [place], [place] [address] [phone]   New Patient Evaluation  Date of Service: 03/09/19 Patient Name: AYIDEN CASILLAS Patient MRN: [id number] Patient DOB: [birth year] Provider: Ventura Sellers, MD  Identifying Statement:  FADY HALES is a 68 y.o. male with lumbar spine mass who presents for initial consultation and evaluation.    Referring Provider: Janith Lima, MD [address],   [id number]  Oncologic History:  History of Present Illness: The patient's records from the referring physician were obtained and reviewed and the patient interviewed to confirm this HPI.  [name] presents today for evaluation of recently uncovered spinal cord lesion.  He describes intermittent numbness affecting left leg, described as "loss of sensation which runs down the leg, all the way to the foot".  It may last for minutes or hours, and has been sporadic over the past 3 months.  He does not feel it has worsened over that time.  There is no described weakness, stiffness, clumsiness with the leg or foot.  The deficit has not significantly impaired his functional status or day to day life.  He denies urinary urgency or incontinence.  No headaches, seizures or other neurologic complaints.  Does have history of lower back pain since 2012, always responsive to local corticosteroid injections.  MRI of the lumbar spine was obtained because of new sensory complaint.  Medications: Current Outpatient Medications on File Prior to Visit  Medication Sig Dispense Refill  . aspirin EC 81 MG tablet Take 1 tablet (81 mg total) by mouth daily. 90 tablet 3  . hydrochlorothiazide (HYDRODIURIL) 25 MG tablet TAKE 1 TABLET BY MOUTH DAILY 90 tablet 1  . losartan (COZAAR) 100 MG tablet TAKE 1 TABLET BY MOUTH EVERY DAY 90 tablet 1  . pioglitazone (ACTOS) 15 MG tablet TAKE 1 TABLET BY MOUTH DAILY 90 tablet 1  . pravastatin  (PRAVACHOL) 20 MG tablet TAKE 1 TABLET BY MOUTH EACH NIGHT AT BEDTIME 90 tablet 1  . SitaGLIPtin-MetFORMIN HCl (JANUMET XR) [phone] MG TB24 Take 1 tablet by mouth daily. 90 tablet 3   No current facility-administered medications on file prior to visit.     Allergies:  Allergies  Allergen Reactions  . Metformin And Related Diarrhea  . Atorvastatin Other (See Comments)    Muscle aches  . Penicillins Rash  . Clindamycin Other (See Comments)    Had c-diff after taking medication Had c-diff after taking medication   . Amoxicillin Rash   Past Medical History:  Past Medical History:  Diagnosis Date  . Chronic kidney disease    kidney stones  . Diabetes mellitus without complication ([place])   . History of chicken pox   . Hypertension    Past Surgical History:  Past Surgical History:  Procedure Laterality Date  . [place]  . POSTERIOR CERVICAL FUSION/FORAMINOTOMY  04/26/2011   Procedure: POSTERIOR CERVICAL FUSION/FORAMINOTOMY LEVEL 3;  Surgeon: Elaina Hoops, MD;  Location: [facility];  Service: Neurosurgery;  Laterality: Bilateral;  Right Cervical five-six,, Left Cervical six-seven, Right Cervical seven thoracic one Posterior Cervical Foraminotomy/ Diskectomy 3hrs Rm 33 (to follow)   Social History:  Social History   Socioeconomic History  . Marital status: Single    Spouse name: Not on file  . Number of children: Not on file  . Years of education: Not on file  . Highest education level: Not on file  Occupational History  . Not on file  Social Needs  . Financial resource strain: Not on file  . Food insecurity    Worry: Not on file    Inability: Not on file  . Transportation needs    Medical: Not on file    Non-medical: Not on file  Tobacco Use  . Smoking status: Never Smoker  . Smokeless tobacco: Never Used  Substance and Sexual Activity  . Alcohol use: No  . Drug use: No  . Sexual activity: Yes  Lifestyle  . Physical activity    Days per week:  Not on file    Minutes per session: Not on file  . Stress: Not on file  Relationships  . Social Herbalist on phone: Not on file    Gets together: Not on file    Attends religious service: Not on file    Active member of club or organization: Not on file    Attends meetings of clubs or organizations: Not on file    Relationship status: Not on file  . Intimate partner violence    Fear of current or ex partner: Not on file    Emotionally abused: Not on file    Physically abused: Not on file    Forced sexual activity: Not on file  Other Topics Concern  . Not on file  Social History Narrative  . Not on file   Family History:  Family History  Problem Relation Age of Onset  . Cancer Mother        breast  . Heart disease Father   . Diabetes Father   . Diabetes Other   . Alcohol abuse Neg Hx   . COPD Neg Hx   . Early death Neg Hx   . Hearing loss Neg Hx   . Hyperlipidemia Neg Hx   . Hypertension Neg Hx   . Kidney disease Neg Hx   . Stroke Neg Hx     Review of Systems: Constitutional: Denies fevers, chills or abnormal weight loss Eyes: Denies blurriness of vision Ears, nose, mouth, throat, and face: Denies mucositis or sore throat Respiratory: Denies cough, dyspnea or wheezes Cardiovascular: Denies palpitation, chest discomfort or lower extremity swelling Gastrointestinal:  Denies nausea, constipation, diarrhea GU: Denies dysuria or incontinence Skin: Denies abnormal skin rashes Neurological: Per HPI Musculoskeletal: Denies joint pain, back or neck discomfort. No decrease in ROM Behavioral/Psych: Denies anxiety, disturbance in thought content, and mood instability  Physical Exam: Vitals:   03/09/19 0958  BP: (!) 151/98  Pulse: 97  Resp: 18  Temp: 98.3 F (36.8 C)  SpO2: 98%   KPS: 90. General: Alert, cooperative, pleasant, in no acute distress Head: Craniotomy scar noted, dry and intact. EENT: No conjunctival injection or scleral icterus. Oral mucosa  moist Lungs: Resp effort normal Cardiac: Regular rate and rhythm Abdomen: Soft, non-distended abdomen Skin: No rashes cyanosis or petechiae. Extremities: No clubbing or edema  Neurologic Exam: Mental Status: Awake, alert, attentive to examiner. Oriented to self and environment. Language is fluent with intact comprehension.  Cranial Nerves: Visual acuity is grossly normal. Visual fields are full. Extra-ocular movements intact. No ptosis. Face is symmetric, tongue midline. Motor: Tone and bulk are normal. Power is full in both arms and legs. Reflexes are symmetric, no pathologic reflexes present. Intact finger to nose bilaterally Sensory: Intact to light touch and temperature Gait: Normal and tandem gait is normal.   Labs: I have reviewed the data as listed  Component Value Date/Time   NA 136 09/22/2018 0935   K 4.1 09/22/2018 0935   CL 101 09/22/2018 0935   CO2 24 09/22/2018 0935   GLUCOSE 118 (H) 09/22/2018 0935   BUN 25 (H) 09/22/2018 0935   CREATININE 1.00 09/22/2018 0935   CALCIUM 9.2 09/22/2018 0935   PROT 7.4 09/22/2018 0935   ALBUMIN 4.6 09/22/2018 0935   AST 22 09/22/2018 0935   ALT 23 09/22/2018 0935   ALKPHOS 34 (L) 09/22/2018 0935   BILITOT 0.7 09/22/2018 0935   GFRNONAA >90 04/19/2011 1037   GFRAA >90 04/19/2011 1037   Lab Results  Component Value Date   WBC 7.7 01/21/2018   NEUTROABS 4.6 01/21/2018   HGB 16.7 01/21/2018   HCT 47.6 01/21/2018   MCV 84.3 01/21/2018   PLT 250.0 01/21/2018    Imaging: CLINICAL DATA: A back pain. Radiculopathy. Symptoms for greater than 6 weeks. Abnormal MRI of the lumbar spine.  EXAM: MRI LUMBAR SPINE WITHOUT AND WITH CONTRAST  TECHNIQUE: Multiplanar and multiecho pulse sequences of the lumbar spine were obtained without and with intravenous contrast.  CONTRAST: 10 mL Gadavist  COMPARISON: MRI lumbar spine without contrast 02/08/2019  FINDINGS: Segmentation: 5 non rib-bearing lumbar type vertebral bodies are  present. The lowest fully formed vertebral body is L5.  Alignment: No significant listhesis is present. Levoconvex curvature is centered at L1-2.  Vertebrae: Marrow signal and vertebral body heights are maintained. No focal lesions are present.  Conus medullaris and cauda equina: Conus extends to the L1-2 level. Previously noted T2 hyperintense lesion within the conus medullaris demonstrates homogeneous enhancement. The lesion measures 4.5 mm. There is faint T2 signal surrounding the lesion. No additional lesions present. There is no nerve root enhancement.  Paraspinal and other soft tissues: Limited imaging the abdomen is unremarkable. There is no significant adenopathy. No solid organ lesions are present.  Disc levels:  There is no significant change in multilevel disc disease as recently described.  IMPRESSION: 1. 4.5 mm intramedullary lesion of the conus medullaris demonstrates homogeneous enhancement. 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 No associated hemorrhage is present. Intramedullary metastatic disease is rare. 2. No other areas of enhancement to suggest additional lesions.   Electronically Signed By: San Morelle M.D. On: 02/22/2019 15:55   Assessment/Plan Lumbar Spine Mass  We appreciate the opportunity to participate in the care of RHYSON TRAUGHBER.  He presents today with clinical and radiographic syndrome localizing to the conus medullaris.  Etiology is unclear at this time, but highest on differential is spinal astrocytoma or myxopapillary ependymoma.  Inflammatory/autoimmune etiology is also in consideration.  Mass effect and location makes vascular or compressive/structural etiology less likely.  Of note, there is an L-spine MRI from 2012 (performed for back pain) which re-demonstrates the T2 characteristics of the mass.  At that time no contrast was administered and the lesion was not followed.  He currently has  imaging of the rest of the neuraxis scheduled for 12/31: Contrast enhanced MRIs of brain, cervical spine and thoracic spine.    At this time the lumbar region is not amenable to resection or biopsy, so we will follow serially with imaging.   He will return to clinic early next month to review complete set of imaging, or sooner for any clinical changes.  We reviewed signs and symptoms of cauda equina syndrome.    Screening for potential clinical trials was performed and discussed using eligibility criteria for active protocols at [facility], loco-regional  tertiary centers, as well as national database available on [email or web address].com.    The patient is not a candidate for a research protocol at this time due to no suitable study identified.   We spent twenty additional minutes teaching regarding the natural history, biology, and historical experience in the treatment of brain tumors. We then discussed in detail the current recommendations for therapy focusing on the mode of administration, mechanism of action, anticipated toxicities, and quality of life issues associated with this plan. We also provided teaching sheets for the patient to take home as an additional resource.  All questions were answered. The patient knows to call the clinic with any problems, questions or concerns. No barriers to learning were detected.  The total time spent in the encounter was 60 minutes and more than 50% was on counseling and review of test results   Ventura Sellers, MD Medical Director of Neuro-Oncology [facility] at [place] 03/09/19 4:57 PM

## 2019-03-10 ENCOUNTER — Telehealth: Payer: Self-pay | Admitting: Internal Medicine

## 2019-03-10 NOTE — Telephone Encounter (Signed)
I left a message regarding schedule  

## 2019-03-15 ENCOUNTER — Other Ambulatory Visit: Payer: Self-pay | Admitting: Internal Medicine

## 2019-03-15 ENCOUNTER — Other Ambulatory Visit: Payer: Self-pay | Admitting: Radiation Therapy

## 2019-03-15 DIAGNOSIS — I10 Essential (primary) hypertension: Secondary | ICD-10-CM

## 2019-03-15 DIAGNOSIS — E118 Type 2 diabetes mellitus with unspecified complications: Secondary | ICD-10-CM

## 2019-04-01 ENCOUNTER — Other Ambulatory Visit: Payer: Medicare Other

## 2019-04-01 ENCOUNTER — Other Ambulatory Visit: Payer: Self-pay

## 2019-04-01 ENCOUNTER — Ambulatory Visit
Admission: RE | Admit: 2019-04-01 | Discharge: 2019-04-01 | Disposition: A | Discharge status: Home / Self Care | Payer: Medicare Other | Source: Ambulatory Visit | Attending: Neurosurgery | Admitting: Neurosurgery

## 2019-04-01 DIAGNOSIS — C720 Malignant neoplasm of spinal cord: Secondary | ICD-10-CM

## 2019-04-01 DIAGNOSIS — G9589 Other specified diseases of spinal cord: Secondary | ICD-10-CM

## 2019-04-01 DIAGNOSIS — C7949 Secondary malignant neoplasm of other parts of nervous system: Secondary | ICD-10-CM

## 2019-04-01 MED ORDER — GADOBENATE DIMEGLUMINE 529 MG/ML IV SOLN
20.0000 mL | Freq: Once | INTRAVENOUS | Status: AC | PRN
  Administered 2019-04-01: 20 mL via INTRAVENOUS

## 2019-04-05 ENCOUNTER — Other Ambulatory Visit: Payer: Self-pay

## 2019-04-05 ENCOUNTER — Inpatient Hospital Stay: Payer: PPO | Attending: Internal Medicine | Admitting: Internal Medicine

## 2019-04-05 VITALS — BP 147/94 | HR 100 | Temp 97.8°F | Resp 17 | Ht 66.75 in | Wt 226.6 lb

## 2019-04-05 DIAGNOSIS — D492 Neoplasm of unspecified behavior of bone, soft tissue, and skin: Secondary | ICD-10-CM | POA: Diagnosis not present

## 2019-04-05 DIAGNOSIS — M545 Low back pain: Secondary | ICD-10-CM | POA: Insufficient documentation

## 2019-04-05 NOTE — Progress Notes (Signed)
[facility] [place], [place] [address] [phone]   Interval Evaluation  Date of Service: 04/05/19 Patient Name: Devon Payne Patient MRN: [id number] Patient DOB: [birth year] Provider: Ventura Sellers, MD  Identifying Statement:  Devon Payne is a 69 y.o. male with lumbar spine mass    Interval History: Devon Payne presents today for follow up after complete set of MRIs (total spine and brain).  He describes no new or progressive symptoms.  Left leg episodes have been sporadic/rare, not problematic lately.  Denies seizures, headaches, back pain.  H+P (03/09/19)  Devon Payne presents today for evaluation of recently uncovered spinal cord lesion.  He describes intermittent numbness affecting left leg, described as "loss of sensation which runs down the leg, all the way to the foot".  It may last for minutes or hours, and has been sporadic over the past 3 months.  He does not feel it has worsened over that time.  There is no described weakness, stiffness, clumsiness with the leg or foot.  The deficit has not significantly impaired his functional status or day to day life.  He denies urinary urgency or incontinence.  No headaches, seizures or other neurologic complaints.  Does have history of lower back pain since 2012, always responsive to local corticosteroid injections.  MRI of the lumbar spine was obtained because of new sensory complaint.  Medications: Current Outpatient Medications on File Prior to Visit  Medication Sig Dispense Refill  . aspirin EC 81 MG tablet Take 1 tablet (81 mg total) by mouth daily. 90 tablet 3  . hydrochlorothiazide (HYDRODIURIL) 25 MG tablet TAKE 1 TABLET BY MOUTH DAILY 90 tablet 1  . losartan (COZAAR) 100 MG tablet TAKE 1 TABLET BY MOUTH EVERY DAY 90 tablet 1  . pioglitazone (ACTOS) 15 MG tablet TAKE 1 TABLET BY MOUTH DAILY 90 tablet 1  . pravastatin (PRAVACHOL) 20 MG tablet TAKE 1 TABLET BY MOUTH  EACH NIGHT AT BEDTIME 90 tablet 1  . SitaGLIPtin-MetFORMIN HCl (JANUMET XR) [phone] MG TB24 Take 1 tablet by mouth daily. 90 tablet 3   No current facility-administered medications on file prior to visit.    Allergies:  Allergies  Allergen Reactions  . Metformin And Related Diarrhea  . Atorvastatin Other (See Comments)    Muscle aches  . Penicillins Rash  . Clindamycin Other (See Comments)    Had c-diff after taking medication Had c-diff after taking medication   . Amoxicillin Rash   Past Medical History:  Past Medical History:  Diagnosis Date  . Chronic kidney disease    kidney stones  . Diabetes mellitus without complication ([place])   . History of chicken pox   . Hypertension    Past Surgical History:  Past Surgical History:  Procedure Laterality Date  . [place]  . POSTERIOR CERVICAL FUSION/FORAMINOTOMY  04/26/2011   Procedure: POSTERIOR CERVICAL FUSION/FORAMINOTOMY LEVEL 3;  Surgeon: Elaina Hoops, MD;  Location: [facility];  Service: Neurosurgery;  Laterality: Bilateral;  Right Cervical five-six,, Left Cervical six-seven, Right Cervical seven thoracic one Posterior Cervical Foraminotomy/ Diskectomy 3hrs Rm 33 (to follow)   Social History:  Social History   Socioeconomic History  . Marital status: Single    Spouse name: Not on file  . Number of children: Not on file  . Years of education: Not on file  . Highest education level: Not on file  Occupational History  . Not on  file  Tobacco Use  . Smoking status: Never Smoker  . Smokeless tobacco: Never Used  Substance and Sexual Activity  . Alcohol use: No  . Drug use: No  . Sexual activity: Yes  Other Topics Concern  . Not on file  Social History Narrative  . Not on file   Social Determinants of Health   Financial Resource Strain:   . Difficulty of Paying Living Expenses: Not on file  Food Insecurity:   . Worried About Charity fundraiser in the Last Year: Not on file  . Ran Out of  Food in the Last Year: Not on file  Transportation Needs:   . Lack of Transportation (Medical): Not on file  . Lack of Transportation (Non-Medical): Not on file  Physical Activity:   . Days of Exercise per Week: Not on file  . Minutes of Exercise per Session: Not on file  Stress:   . Feeling of Stress : Not on file  Social Connections:   . Frequency of Communication with Friends and Family: Not on file  . Frequency of Social Gatherings with Friends and Family: Not on file  . Attends Religious Services: Not on file  . Active Member of Clubs or Organizations: Not on file  . Attends Archivist Meetings: Not on file  . Marital Status: Not on file  Intimate Partner Violence:   . Fear of Current or Ex-Partner: Not on file  . Emotionally Abused: Not on file  . Physically Abused: Not on file  . Sexually Abused: Not on file   Family History:  Family History  Problem Relation Age of Onset  . Cancer Mother        breast  . Heart disease Father   . Diabetes Father   . Diabetes Other   . Alcohol abuse Neg Hx   . COPD Neg Hx   . Early death Neg Hx   . Hearing loss Neg Hx   . Hyperlipidemia Neg Hx   . Hypertension Neg Hx   . Kidney disease Neg Hx   . Stroke Neg Hx     Review of Systems: Constitutional: Denies fevers, chills or abnormal weight loss Eyes: Denies blurriness of vision Ears, nose, mouth, throat, and face: Denies mucositis or sore throat Respiratory: Denies cough, dyspnea or wheezes Cardiovascular: Denies palpitation, chest discomfort or lower extremity swelling Gastrointestinal:  Denies nausea, constipation, diarrhea GU: Denies dysuria or incontinence Skin: Denies abnormal skin rashes Neurological: Per HPI Musculoskeletal: Denies joint pain, back or neck discomfort. No decrease in ROM Behavioral/Psych: Denies anxiety, disturbance in thought content, and mood instability  Physical Exam: Vitals:   04/05/19 0947  BP: (!) 147/94  Pulse: 100  Resp: 17    Temp: 97.8 F (36.6 C)  SpO2: 99%   KPS: 90. General: Alert, cooperative, pleasant, in no acute distress Head: Normal. EENT: No conjunctival injection or scleral icterus. Oral mucosa moist Lungs: Resp effort normal Cardiac: Regular rate and rhythm Abdomen: Soft, non-distended abdomen Skin: No rashes cyanosis or petechiae. Extremities: No clubbing or edema  Neurologic Exam: Mental Status: Awake, alert, attentive to examiner. Oriented to self and environment. Language is fluent with intact comprehension.  Cranial Nerves: Visual acuity is grossly normal. Visual fields are full. Extra-ocular movements intact. No ptosis. Face is symmetric, tongue midline. Motor: Tone and bulk are normal. Power is full in both arms and legs. Reflexes are symmetric, no pathologic reflexes present. Intact finger to nose bilaterally Sensory: Intact to light touch and temperature  Gait: Normal and tandem gait is normal.   Labs: I have reviewed the data as listed    Component Value Date/Time   NA 136 09/22/2018 0935   K 4.1 09/22/2018 0935   CL 101 09/22/2018 0935   CO2 24 09/22/2018 0935   GLUCOSE 118 (H) 09/22/2018 0935   BUN 25 (H) 09/22/2018 0935   CREATININE 1.00 09/22/2018 0935   CALCIUM 9.2 09/22/2018 0935   PROT 7.4 09/22/2018 0935   ALBUMIN 4.6 09/22/2018 0935   AST 22 09/22/2018 0935   ALT 23 09/22/2018 0935   ALKPHOS 34 (L) 09/22/2018 0935   BILITOT 0.7 09/22/2018 0935   GFRNONAA >90 04/19/2011 1037   GFRAA >90 04/19/2011 1037   Lab Results  Component Value Date   WBC 7.7 01/21/2018   NEUTROABS 4.6 01/21/2018   HGB 16.7 01/21/2018   HCT 47.6 01/21/2018   MCV 84.3 01/21/2018   PLT 250.0 01/21/2018   Imaging:  [facility] Clinician Interpretation: I have personally reviewed the CNS images as listed.  My interpretation, in the context of the patient's clinical presentation, is stable disease  MR BRAIN W WO CONTRAST  Result Date: 04/01/2019 CLINICAL DATA:  Enhancing lesion conus  medullaris. Rule out metastatic disease. EXAM: MRI HEAD WITHOUT AND WITH CONTRAST TECHNIQUE: Multiplanar, multiecho pulse sequences of the brain and surrounding structures were obtained without and with intravenous contrast. CONTRAST:  48mL MULTIHANCE GADOBENATE DIMEGLUMINE 529 MG/ML IV SOLN COMPARISON:  None. FINDINGS: Brain: Generalized atrophy. Negative for infarct, hemorrhage, mass. No edema. No enhancing lesion identified Vascular: Normal arterial flow voids. Skull and upper cervical spine: No focal skeletal lesion. Sinuses/Orbits: Negative Other: None IMPRESSION: Generalized atrophy. No acute abnormality. No enhancing lesion identified in the brain. Electronically Signed   By: Franchot Gallo M.D.   On: 04/01/2019 15:11   MR CERVICAL SPINE W WO CONTRAST  Result Date: 04/01/2019 CLINICAL DATA:  Enhancing lesions conus medullaris. Rule out metastatic disease. Creatinine was obtained on site at [facility] at [address] Results: Creatinine 1.2 mg/dL. EXAM: MRI CERVICAL SPINE WITHOUT AND WITH CONTRAST TECHNIQUE: Multiplanar and multiecho pulse sequences of the cervical spine, to include the craniocervical junction and cervicothoracic junction, were obtained without and with intravenous contrast. CONTRAST:  74mL MULTIHANCE GADOBENATE DIMEGLUMINE 529 MG/ML IV SOLN COMPARISON:  None. FINDINGS: Alignment: Mild retrolisthesis C3-4.  2 mm retrolisthesis C4-5. Vertebrae: No vertebral mass or fracture.  Normal bone marrow Cord: Normal cord signal. No enhancing cord lesion identified. No cord compression. Posterior Fossa, vertebral arteries, paraspinal tissues: Negative Disc levels: C2-3: Negative C3-4: Mild left foraminal narrowing due to spurring and facet hypertrophy. C4-5: Small central disc protrusion. Mild left foraminal narrowing due to facet hypertrophy C5-6: Moderate right foraminal narrowing due to uncinate spurring. Left foramen patent. C6-7: Moderate left foraminal narrowing due to uncinate  spurring and facet hypertrophy. No spinal stenosis C7-T1: Small central disc protrusion.  Negative for stenosis. IMPRESSION: Mild degenerative changes cervical spine Negative for mass lesion. Electronically Signed   By: Franchot Gallo M.D.   On: 04/01/2019 15:15   MR THORACIC SPINE W WO CONTRAST  Result Date: 04/01/2019 CLINICAL DATA:  Enhancing lesion conus medullaris. Creatinine was obtained on site at [facility] at [address] Results: Creatinine 1.2 mg/dL. EXAM: MRI THORACIC WITHOUT AND WITH CONTRAST TECHNIQUE: Multiplanar and multiecho pulse sequences of the thoracic spine were obtained without and with intravenous contrast. CONTRAST:  52mL MULTIHANCE GADOBENATE DIMEGLUMINE 529 MG/ML IV SOLN COMPARISON:  MRI lumbar spine 02/08/2019,  02/22/2019, 07/10/2010 FINDINGS: MRI THORACIC SPINE FINDINGS Alignment:  Mild anterolisthesis T2-3.  Accentuated dorsal kyphosis. Vertebrae: Negative for fracture. T5 and T10 hemangioma. No evidence of skeletal metastatic disease. Cord: Mild cord edema at T11-12. Small enhancing nodule within the cord measuring approximately 3 x 5 mm. There appears to be surrounding edema in the cord. The enhancing lesion is intramedullary. This is similar to the recent MRI. Review of the prior MRI from 2012 was done without contrast but does reveal edema in the cord at this level. The edema shows mild progression since 2012. Paraspinal and other soft tissues: Negative for paraspinous mass or adenopathy. Disc levels: Multilevel disc degeneration. Significant disc pathology described below T2-3: Moderately large central disc protrusion with cord flattening and mild spinal stenosis. T3-4: Small central disc protrusion with mild cord flattening T4-5: Small right paracentral disc protrusion with cord flattening on the right. T5-6: Mild right-sided disc protrusion with mild cord flattening T7-8: Moderate disc protrusion on the left with cord flattening on the left. Mild spinal  stenosis T8-9: Mild to moderate left-sided disc protrusion with mild cord flattening T9-10: Small central disc protrusion.  Mild cord flattening IMPRESSION: 3 x 5 mm intramedullary enhancing lesion in the cord at T11-12. Mild surrounding edema in the cord. The edema has progressed since 2012. Prior MRI 2012 does not include intravenous contrast. No other enhancing lesions identified. Given the indolent nature of this lesion, a benign lesion is favored. Consider vascular malformation such as cavernoma or hemangioblastoma. Low-grade tumor possible. Metastatic disease felt to be low in the differential. Electronically Signed   By: Franchot Gallo M.D.   On: 04/01/2019 15:32     Assessment/Plan Lumbar Spine Mass   LAQUANE DELAUGHTER is clinically and radiographically stable today.  No novel sites of disease are identified on neuraxis imaging. Etiology of cauda equina mass is unclear; may be low grade neoplasm or vascular malformation.  Lesion is not amenable to resection or biopsy, so we will continue follow serially with imaging.   He will return to clinic in 1 year following contrast enahnced MRI T/L spine, or sooner if needed.  All questions were answered. The patient knows to call the clinic with any problems, questions or concerns. No barriers to learning were detected.  The total time spent in the encounter was 25 minutes and more than 50% was on counseling and review of test results   Ventura Sellers, MD Medical Director of Neuro-Oncology [facility] at [place] 04/05/19 10:01 AM

## 2019-04-06 ENCOUNTER — Telehealth: Payer: Self-pay | Admitting: Internal Medicine

## 2019-04-06 NOTE — Telephone Encounter (Signed)
I left a message regarding schedule  

## 2019-04-27 ENCOUNTER — Encounter: Payer: Self-pay | Admitting: Internal Medicine

## 2019-05-22 ENCOUNTER — Ambulatory Visit: Payer: PPO | Attending: Internal Medicine

## 2019-05-22 DIAGNOSIS — Z23 Encounter for immunization: Secondary | ICD-10-CM | POA: Insufficient documentation

## 2019-05-22 NOTE — Progress Notes (Signed)
   Covid-19 Vaccination Clinic  Name:  HAMZA SALDANHA    MRN: [id number] DOB: [birth year]  05/22/2019  Mr. Albers was observed post Covid-19 immunization for 15 minutes without incidence. He was provided with Vaccine Information Sheet and instruction to access the V-Safe system.   Mr. Parsa was instructed to call 911 with any severe reactions post vaccine: Marland Kitchen Difficulty breathing  . Swelling of your face and throat  . A fast heartbeat  . A bad rash all over your body  . Dizziness and weakness    Immunizations Administered    Name Date Dose VIS Date Route   Pfizer COVID-19 Vaccine 05/22/2019 10:07 AM 0.3 mL 03/12/2019 Intramuscular   Manufacturer: [place]   Lot: [id number]   [place]: [id number]

## 2019-06-04 ENCOUNTER — Encounter: Payer: Self-pay | Admitting: Internal Medicine

## 2019-06-15 ENCOUNTER — Ambulatory Visit: Payer: PPO | Attending: Internal Medicine

## 2019-06-15 DIAGNOSIS — Z23 Encounter for immunization: Secondary | ICD-10-CM

## 2019-06-15 NOTE — Progress Notes (Signed)
   Covid-19 Vaccination Clinic  Name:  Devon Payne    MRN: [id number] DOB: [birth year]  06/15/2019  Devon Payne was observed post Covid-19 immunization for 15 minutes without incident. He was provided with Vaccine Information Sheet and instruction to access the V-Safe system.   Devon Payne was instructed to call 911 with any severe reactions post vaccine: Marland Kitchen Difficulty breathing  . Swelling of face and throat  . A fast heartbeat  . A bad rash all over body  . Dizziness and weakness   Immunizations Administered    Name Date Dose VIS Date Route   Pfizer COVID-19 Vaccine 06/15/2019 10:35 AM 0.3 mL 03/12/2019 Intramuscular   Manufacturer: [place]   Lot: [id number]   [place]: [id number]

## 2019-06-24 ENCOUNTER — Other Ambulatory Visit: Payer: Self-pay | Admitting: Internal Medicine

## 2019-06-24 DIAGNOSIS — E785 Hyperlipidemia, unspecified: Secondary | ICD-10-CM

## 2019-06-24 DIAGNOSIS — E118 Type 2 diabetes mellitus with unspecified complications: Secondary | ICD-10-CM

## 2019-06-28 ENCOUNTER — Encounter: Payer: Self-pay | Admitting: Internal Medicine

## 2019-07-15 ENCOUNTER — Encounter: Payer: Self-pay | Admitting: Internal Medicine

## 2019-09-10 ENCOUNTER — Telehealth: Payer: Self-pay

## 2019-09-14 NOTE — Telephone Encounter (Signed)
Mychart sent to patient.

## 2019-09-22 ENCOUNTER — Other Ambulatory Visit: Payer: Self-pay | Admitting: Internal Medicine

## 2019-09-22 DIAGNOSIS — E785 Hyperlipidemia, unspecified: Secondary | ICD-10-CM

## 2019-09-22 DIAGNOSIS — K760 Fatty (change of) liver, not elsewhere classified: Secondary | ICD-10-CM

## 2019-09-22 DIAGNOSIS — E118 Type 2 diabetes mellitus with unspecified complications: Secondary | ICD-10-CM

## 2019-09-22 DIAGNOSIS — I10 Essential (primary) hypertension: Secondary | ICD-10-CM

## 2019-09-23 ENCOUNTER — Encounter: Payer: Self-pay | Admitting: Internal Medicine

## 2019-09-28 ENCOUNTER — Encounter: Payer: Self-pay | Admitting: Internal Medicine

## 2019-09-28 ENCOUNTER — Ambulatory Visit (INDEPENDENT_AMBULATORY_CARE_PROVIDER_SITE_OTHER): Payer: PPO | Admitting: Internal Medicine

## 2019-09-28 ENCOUNTER — Other Ambulatory Visit: Payer: Self-pay

## 2019-09-28 ENCOUNTER — Ambulatory Visit (INDEPENDENT_AMBULATORY_CARE_PROVIDER_SITE_OTHER): Payer: PPO

## 2019-09-28 VITALS — BP 140/84 | HR 90 | Temp 98.2°F | Resp 16 | Ht 67.0 in | Wt 226.0 lb

## 2019-09-28 VITALS — BP 130/80 | HR 90 | Temp 98.2°F | Resp 16 | Ht 67.0 in | Wt 226.0 lb

## 2019-09-28 DIAGNOSIS — N400 Enlarged prostate without lower urinary tract symptoms: Secondary | ICD-10-CM | POA: Diagnosis not present

## 2019-09-28 DIAGNOSIS — K7581 Nonalcoholic steatohepatitis (NASH): Secondary | ICD-10-CM | POA: Diagnosis not present

## 2019-09-28 DIAGNOSIS — K760 Fatty (change of) liver, not elsewhere classified: Secondary | ICD-10-CM

## 2019-09-28 DIAGNOSIS — E785 Hyperlipidemia, unspecified: Secondary | ICD-10-CM | POA: Diagnosis not present

## 2019-09-28 DIAGNOSIS — I10 Essential (primary) hypertension: Secondary | ICD-10-CM | POA: Diagnosis not present

## 2019-09-28 DIAGNOSIS — Z0000 Encounter for general adult medical examination without abnormal findings: Secondary | ICD-10-CM | POA: Diagnosis not present

## 2019-09-28 DIAGNOSIS — E118 Type 2 diabetes mellitus with unspecified complications: Secondary | ICD-10-CM

## 2019-09-28 DIAGNOSIS — Z23 Encounter for immunization: Secondary | ICD-10-CM

## 2019-09-28 LAB — MICROALBUMIN / CREATININE URINE RATIO
Creatinine,U: 89.7 mg/dL
Microalb Creat Ratio: 1.9 mg/g (ref 0.0–30.0)
Microalb, Ur: 1.7 mg/dL (ref 0.0–1.9)

## 2019-09-28 LAB — BASIC METABOLIC PANEL
BUN: 22 mg/dL (ref 6–23)
CO2: 28 mEq/L (ref 19–32)
Calcium: 10.1 mg/dL (ref 8.4–10.5)
Chloride: 97 mEq/L (ref 96–112)
Creatinine, Ser: 1.24 mg/dL (ref 0.40–1.50)
GFR: 57.73 mL/min — ABNORMAL LOW (ref >60.00)
Glucose, Bld: 109 mg/dL — ABNORMAL HIGH (ref 70–99)
Potassium: 4.7 mEq/L (ref 3.5–5.1)
Sodium: 136 mEq/L (ref 135–145)

## 2019-09-28 LAB — URINALYSIS, ROUTINE W REFLEX MICROSCOPIC
Bilirubin Urine: NEGATIVE
Hgb urine dipstick: NEGATIVE
Ketones, ur: NEGATIVE
Leukocytes,Ua: NEGATIVE
Nitrite: NEGATIVE
RBC / HPF: NONE SEEN (ref 0–?)
Specific Gravity, Urine: 1.02 (ref 1.000–1.030)
Total Protein, Urine: NEGATIVE
Urine Glucose: NEGATIVE
Urobilinogen, UA: 0.2 (ref 0.0–1.0)
pH: 5.5 (ref 5.0–8.0)

## 2019-09-28 LAB — POCT GLYCOSYLATED HEMOGLOBIN (HGB A1C): Hemoglobin A1C: 5.8 % — AB (ref 4.0–5.6)

## 2019-09-28 LAB — HEPATIC FUNCTION PANEL
ALT: 35 U/L (ref 0–53)
AST: 34 U/L (ref 0–37)
Albumin: 4.9 g/dL (ref 3.5–5.2)
Alkaline Phosphatase: 32 U/L — ABNORMAL LOW (ref 39–117)
Bilirubin, Direct: 0.2 mg/dL (ref 0.0–0.3)
Total Bilirubin: 0.8 mg/dL (ref 0.2–1.2)
Total Protein: 7.6 g/dL (ref 6.0–8.3)

## 2019-09-28 LAB — CBC WITH DIFFERENTIAL/PLATELET
Basophils Absolute: 0.1 10*3/uL (ref 0.0–0.1)
Basophils Relative: 1 % (ref 0.0–3.0)
Eosinophils Absolute: 0 10*3/uL (ref 0.0–0.7)
Eosinophils Relative: 0.5 % (ref 0.0–5.0)
HCT: 43.9 % (ref 39.0–52.0)
Hemoglobin: 15 g/dL (ref 13.0–17.0)
Lymphocytes Relative: 20 % (ref 12.0–46.0)
Lymphs Abs: 1.7 10*3/uL (ref 0.7–4.0)
MCHC: 34.2 g/dL (ref 30.0–36.0)
MCV: 85.7 fl (ref 78.0–100.0)
Monocytes Absolute: 0.9 10*3/uL (ref 0.1–1.0)
Monocytes Relative: 10.2 % (ref 3.0–12.0)
Neutro Abs: 6 10*3/uL (ref 1.4–7.7)
Neutrophils Relative %: 68.3 % (ref 43.0–77.0)
Platelets: 261 10*3/uL (ref 150.0–400.0)
RBC: 5.12 Mil/uL (ref 4.22–5.81)
RDW: 14.3 % (ref 11.5–15.5)
WBC: 8.7 10*3/uL (ref 4.0–10.5)

## 2019-09-28 LAB — LIPID PANEL
Cholesterol: 182 mg/dL (ref 0–200)
HDL: 48.8 mg/dL (ref >39.00)
NonHDL: 132.98
Total CHOL/HDL Ratio: 4
Triglycerides: 207 mg/dL — ABNORMAL HIGH (ref 0.0–149.0)
VLDL: 41.4 mg/dL — ABNORMAL HIGH (ref 0.0–40.0)

## 2019-09-28 LAB — TSH: TSH: 1.49 u[IU]/mL (ref 0.35–4.50)

## 2019-09-28 LAB — LDL CHOLESTEROL, DIRECT: Direct LDL: 110 mg/dL

## 2019-09-28 LAB — PSA: PSA: 2.28 ng/mL (ref 0.10–4.00)

## 2019-09-28 MED ORDER — HYDROCHLOROTHIAZIDE 25 MG PO TABS: 25.0000 mg | ORAL_TABLET | Freq: Every day | ORAL | 1 refills | Status: DC

## 2019-09-28 MED ORDER — ASPIRIN EC 81 MG PO TBEC: 81.0000 mg | DELAYED_RELEASE_TABLET | Freq: Every day | ORAL | 3 refills | Status: DC

## 2019-09-28 MED ORDER — ZOSTER VAC RECOMB ADJUVANTED 50 MCG/0.5ML IM SUSR: 0.5000 mL | Freq: Once | INTRAMUSCULAR | 1 refills | Status: AC

## 2019-09-28 MED ORDER — PRAVASTATIN SODIUM 20 MG PO TABS: ORAL_TABLET | ORAL | 1 refills | Status: DC

## 2019-09-28 MED ORDER — LOSARTAN POTASSIUM 100 MG PO TABS: 100.0000 mg | ORAL_TABLET | Freq: Every day | ORAL | 1 refills | Status: DC

## 2019-09-28 MED ORDER — PIOGLITAZONE HCL 15 MG PO TABS: 15.0000 mg | ORAL_TABLET | Freq: Every day | ORAL | 1 refills | Status: DC

## 2019-09-28 NOTE — Patient Instructions (Signed)

## 2019-09-28 NOTE — Progress Notes (Signed)
Subjective:   Devon Payne is a 69 y.o. male who presents for Medicare Annual/Subsequent preventive examination.  Review of Systems    No ROS. Medicare Wellness Visit Cardiac Risk Factors include: advanced age (>63men, >2 women);dyslipidemia;family history of premature cardiovascular disease;hypertension;male gender;obesity (BMI >30kg/m2)     Objective:    Today's Vitals   09/28/19 1401 09/28/19 1406  BP: 130/80   Pulse: 90   Resp: 16   Temp: 98.2 F (36.8 C)   SpO2: 97%   Weight: 226 lb (102.5 kg)   Height: 5\' 7"  (1.702 m)   PainSc: 0-No pain 0-No pain   Body mass index is 35.4 kg/m.  Advanced Directives 09/28/2019 04/05/2019 03/09/2019 04/30/2018 06/16/2016 04/19/2011  Does Patient Have a Medical Advance Directive? No No No No No Patient does not have advance directive  Would patient like information on creating a medical advance directive? Yes (MAU/Ambulatory/Procedural Areas - Information given) No - Patient declined No - Patient declined No - Patient declined No - Patient declined -    Current Medications (verified) Outpatient Encounter Medications as of 09/28/2019  Medication Sig  . aspirin EC 81 MG tablet Take 1 tablet (81 mg total) by mouth daily.  . hydrochlorothiazide (HYDRODIURIL) 25 MG tablet TAKE 1 TABLET BY MOUTH DAILY  . losartan (COZAAR) 100 MG tablet TAKE 1 TABLET BY MOUTH EVERY DAY  . pioglitazone (ACTOS) 15 MG tablet TAKE 1 TABLET BY MOUTH DAILY  . pravastatin (PRAVACHOL) 20 MG tablet TAKE 1 TABLET BY MOUTH EACH NIGHT AT BEDTIME  . SitaGLIPtin-MetFORMIN HCl (JANUMET XR) [phone] MG TB24 Take 1 tablet by mouth daily.   No facility-administered encounter medications on file as of 09/28/2019.    Allergies (verified) Metformin and related, Atorvastatin, Penicillins, Clindamycin, and Amoxicillin   History: Past Medical History:  Diagnosis Date  . Chronic kidney disease    kidney stones  . Diabetes mellitus without complication ([place])   . History of  chicken pox   . Hypertension    Past Surgical History:  Procedure Laterality Date  . [place]  . POSTERIOR CERVICAL FUSION/FORAMINOTOMY  04/26/2011   Procedure: POSTERIOR CERVICAL FUSION/FORAMINOTOMY LEVEL 3;  Surgeon: Devon Hoops, MD;  Location: [facility];  Service: Neurosurgery;  Laterality: Bilateral;  Right Cervical five-six,, Left Cervical six-seven, Right Cervical seven thoracic one Posterior Cervical Foraminotomy/ Diskectomy 3hrs Rm 33 (to follow)   Family History  Problem Relation Age of Onset  . Cancer Mother        breast  . Heart disease Father   . Diabetes Father   . Diabetes Other   . Alcohol abuse Neg Hx   . COPD Neg Hx   . Early death Neg Hx   . Hearing loss Neg Hx   . Hyperlipidemia Neg Hx   . Hypertension Neg Hx   . Kidney disease Neg Hx   . Stroke Neg Hx    Social History   Socioeconomic History  . Marital status: Single    Spouse name: Not on file  . Number of children: Not on file  . Years of education: Not on file  . Highest education level: Not on file  Occupational History  . Not on file  Tobacco Use  . Smoking status: Never Smoker  . Smokeless tobacco: Never Used  Substance and Sexual Activity  . Alcohol use: No  . Drug use: No  . Sexual activity: Yes  Other Topics Concern  . Not on file  Social History Narrative  . Not on file   Social Determinants of Health   Financial Resource Strain: Low Risk   . Difficulty of Paying Living Expenses: Not hard at all  Food Insecurity: No Food Insecurity  . Worried About Charity fundraiser in the Last Year: Never true  . Ran Out of Food in the Last Year: Never true  Transportation Needs: No Transportation Needs  . Lack of Transportation (Medical): No  . Lack of Transportation (Non-Medical): No  Physical Activity: Sufficiently Active  . Days of Exercise per Week: 5 days  . Minutes of Exercise per Session: 30 min  Stress: No Stress Concern Present  . Feeling of Stress : Not at  all  Social Connections: Unknown  . Frequency of Communication with Friends and Family: More than three times a week  . Frequency of Social Gatherings with Friends and Family: More than three times a week  . Attends Religious Services: Not on file  . Active Member of Clubs or Organizations: Not on file  . Attends Archivist Meetings: Not on file  . Marital Status: Never married    Tobacco Counseling Counseling given: No   Clinical Intake:  Pre-visit preparation completed: Yes  Pain : No/denies pain Pain Score: 0-No pain     BMI - recorded: 35.4 Nutritional Status: BMI > 30  Obese Nutritional Risks: None Diabetes: No  How often do you need to have someone help you when you read instructions, pamphlets, or other written materials from your doctor or pharmacy?: 1 - Never What is the last grade level you completed in school?: Bachelor's Degree  Diabetic? no  Interpreter Needed?: No  Information entered by :: Avonelle Viveros N. Danelia Snodgrass, LPN   Activities of Daily Living In your present state of health, do you have any difficulty performing the following activities: 09/28/2019  Hearing? N  Vision? N  Difficulty concentrating or making decisions? N  Walking or climbing stairs? N  Dressing or bathing? N  Doing errands, shopping? N  Preparing Food and eating ? N  Using the Toilet? N  In the past six months, have you accidently leaked urine? N  Do you have problems with loss of bowel control? N  Managing your Medications? N  Managing your Finances? N  Housekeeping or managing your Housekeeping? N  Some recent data might be hidden    Patient Care Team: Janith Lima, MD as PCP - General (Internal Medicine)  Indicate any recent Medical Services you may have received from other than [facility] providers in the past year (date may be approximate).     Assessment:   This is a routine wellness examination for Devon Payne.  Hearing/Vision screen No exam data present  Dietary  issues and exercise activities discussed: Current Exercise Habits: Home exercise routine, Type of exercise: walking, Time (Minutes): 30, Frequency (Times/Week): 5, Weekly Exercise (Minutes/Week): 150, Intensity: Mild, Exercise limited by: orthopedic condition(s);neurologic condition(s) (back and knee issues)  Goals    .  Client understands the importance of follow-up with providers by attending scheduled visits (pt-stated)      Would like to lose 20 pounds.      Depression Screen PHQ 2/9 Scores 09/28/2019 04/30/2018 01/23/2018 01/21/2018 06/16/2016 06/13/2016  PHQ - 2 Score 0 0 0 0 0 0    Fall Risk Fall Risk  09/28/2019 04/30/2018 01/23/2018 01/21/2018 06/16/2016  Falls in the past year? 0 0 No No No  Number falls in past yr: 0 - - - -  Injury with Fall? 0 - - - -  Risk for fall due to : Orthopedic patient - - - -  Follow up Falls evaluation completed;Education provided - - - -    Any stairs in or around the home? No  If so, are there any without handrails? No  Home free of loose throw rugs in walkways, pet beds, electrical cords, etc? Yes  Adequate lighting in your home to reduce risk of falls? Yes   ASSISTIVE DEVICES UTILIZED TO PREVENT FALLS:  Life alert? No  Use of a cane, walker or w/c? No  Grab bars in the bathroom? No  Shower chair or bench in shower? Yes  Elevated toilet seat or a handicapped toilet? No   TIMED UP AND GO:  Was the test performed? Yes .  Length of time to ambulate 10 feet: 6 sec.   Gait steady and fast without use of assistive device  Cognitive Function:     6CIT Screen 09/28/2019  What Year? 0 points  What month? 0 points  What time? 0 points  Count back from 20 0 points  Months in reverse 0 points  Repeat phrase 0 points  Total Score 0    Immunizations Immunization History  Administered Date(s) Administered  . Fluad Quad(high Dose 65+) 11/25/2018  . Hep A / Hep B 09/28/2018, 11/25/2018, 02/17/2019  . Influenza,inj,Quad PF,6+ Mos 01/05/2018   . Influenza-Unspecified 01/25/2015, 01/31/2016  . PFIZER SARS-COV-2 Vaccination 05/22/2019, 06/15/2019  . Pneumococcal Conjugate-13 06/13/2016  . Pneumococcal Polysaccharide-23 01/21/2018  . Td 09/16/2013  . Zoster 09/22/2014    TDAP status: Up to date Flu Vaccine status: Up to date Pneumococcal vaccine status: Up to date Covid-19 vaccine status: Completed vaccines  Qualifies for Shingles Vaccine? Yes   Zostavax completed Yes   Shingrix Completed?: No.    Education has been provided regarding the importance of this vaccine. Patient has been advised to call insurance company to determine out of pocket expense if they have not yet received this vaccine. Advised may also receive vaccine at local pharmacy or Health Dept. Verbalized acceptance and understanding.  Screening Tests Health Maintenance  Topic Date Due  . FOOT EXAM  01/22/2019  . HEMOGLOBIN A1C  03/24/2019  . OPHTHALMOLOGY EXAM  10/28/2019  . INFLUENZA VACCINE  10/31/2019  . COLONOSCOPY  09/17/2023  . TETANUS/TDAP  09/17/2023  . COVID-19 Vaccine  Completed  . Hepatitis C Screening  Completed  . PNA vac Low Risk Adult  Completed    Health Maintenance  Health Maintenance Due  Topic Date Due  . FOOT EXAM  01/22/2019  . HEMOGLOBIN A1C  03/24/2019    Colorectal cancer screening: Completed 09/16/2013. Repeat every 10 years  Lung Cancer Screening: (Low Dose CT Chest recommended if Age 51-80 years, 30 pack-year currently smoking OR have quit w/in 15years.) does not qualify.   Lung Cancer Screening Referral: no  Additional Screening:  Hepatitis C Screening: does qualify; Completed yes  Vision Screening: Recommended annual ophthalmology exams for early detection of glaucoma and other disorders of the eye. Is the patient up to date with their annual eye exam?  Yes  Who is the provider or what is the name of the office in which the patient attends annual eye exams? [facility] in [place], [place] If pt is not  established with a provider, would they like to be referred to a provider to establish care? No .   Dental Screening: Recommended annual dental exams for proper oral hygiene  Community  Resource Referral / Chronic Care Management: CRR required this visit?  No   CCM required this visit?  No      Plan:     I have personally reviewed and noted the following in the patient's chart:   . Medical and social history . Use of alcohol, tobacco or illicit drugs  . Current medications and supplements . Functional ability and status . Nutritional status . Physical activity . Advanced directives . List of other physicians . Hospitalizations, surgeries, and ER visits in previous 12 months . Vitals . Screenings to include cognitive, depression, and falls . Referrals and appointments  In addition, I have reviewed and discussed with patient certain preventive protocols, quality metrics, and best practice recommendations. A written personalized care plan for preventive services as well as general preventive health recommendations were provided to patient.     Sheral Flow, LPN   [id number]   Nurse Notes: n/a

## 2019-09-28 NOTE — Patient Instructions (Signed)
Devon Payne , Thank you for taking time to come for your Medicare Wellness Visit. I appreciate your ongoing commitment to your health goals. Please review the following plan we discussed and let me know if I can assist you in the future.   Screening recommendations/referrals: Colonoscopy: 09/16/2013; due every 10 years Recommended yearly ophthalmology/optometry visit for glaucoma screening and checkup Recommended yearly dental visit for hygiene and checkup  Vaccinations: Influenza vaccine: 11/25/2018 Pneumococcal vaccine: completed Tdap vaccine: 09/16/2013 Shingles vaccine: never done; will talk with Dr. Ronnald Ramp   Covid-19: completed  Advanced directives: Advance directive discussed with you today. I have provided a copy for you to complete at home and have notarized. Once this is complete please bring a copy in to our office so we can scan it into your chart.  Conditions/risks identified: Please continue to do your personal lifestyle choices by: daily care of teeth and gums, regular physical activity (goal should be 5 days a week for 30 minutes), eat a healthy diet, avoid tobacco and drug use, limiting any alcohol intake, taking a low-dose aspirin (if not allergic or have been advised by your provider otherwise) and taking vitamins and minerals as recommended by your provider. Continue doing brain stimulating activities (puzzles, reading, adult coloring books, staying active) to keep memory sharp. Continue to eat heart healthy diet (full of fruits, vegetables, whole grains, lean protein, water--limit salt, fat, and sugar intake) and increase physical activity as tolerated.  Next appointment: Please schedule your next Medicare Wellness Visit with your Nurse Health Advisor in 1 year.  Preventive Care 35 Years and Older, Male Preventive care refers to lifestyle choices and visits with your health care provider that can promote health and wellness. What does preventive care include?  A yearly  physical exam. This is also called an annual well check.  Dental exams once or twice a year.  Routine eye exams. Ask your health care provider how often you should have your eyes checked.  Personal lifestyle choices, including:  Daily care of your teeth and gums.  Regular physical activity.  Eating a healthy diet.  Avoiding tobacco and drug use.  Limiting alcohol use.  Practicing safe sex.  Taking low doses of aspirin every day.  Taking vitamin and mineral supplements as recommended by your health care provider. What happens during an annual well check? The services and screenings done by your health care provider during your annual well check will depend on your age, overall health, lifestyle risk factors, and family history of disease. Counseling  Your health care provider may ask you questions about your:  Alcohol use.  Tobacco use.  Drug use.  Emotional well-being.  Home and relationship well-being.  Sexual activity.  Eating habits.  History of falls.  Memory and ability to understand (cognition).  Work and work Statistician. Screening  You may have the following tests or measurements:  Height, weight, and BMI.  Blood pressure.  Lipid and cholesterol levels. These may be checked every 5 years, or more frequently if you are over 67 years old.  Skin check.  Lung cancer screening. You may have this screening every year starting at age 43 if you have a 30-pack-year history of smoking and currently smoke or have quit within the past 15 years.  Fecal occult blood test (FOBT) of the stool. You may have this test every year starting at age 23.  Flexible sigmoidoscopy or colonoscopy. You may have a sigmoidoscopy every 5 years or a colonoscopy every 10 years starting at  age 83.  Prostate cancer screening. Recommendations will vary depending on your family history and other risks.  Hepatitis C blood test.  Hepatitis B blood test.  Sexually transmitted  disease (STD) testing.  Diabetes screening. This is done by checking your blood sugar (glucose) after you have not eaten for a while (fasting). You may have this done every 1-3 years.  Abdominal aortic aneurysm (AAA) screening. You may need this if you are a current or former smoker.  Osteoporosis. You may be screened starting at age 32 if you are at high risk. Talk with your health care provider about your test results, treatment options, and if necessary, the need for more tests. Vaccines  Your health care provider may recommend certain vaccines, such as:  Influenza vaccine. This is recommended every year.  Tetanus, diphtheria, and acellular pertussis (Tdap, Td) vaccine. You may need a Td booster every 10 years.  Zoster vaccine. You may need this after age 75.  Pneumococcal 13-valent conjugate (PCV13) vaccine. One dose is recommended after age 27.  Pneumococcal polysaccharide (PPSV23) vaccine. One dose is recommended after age 67. Talk to your health care provider about which screenings and vaccines you need and how often you need them. This information is not intended to replace advice given to you by your health care provider. Make sure you discuss any questions you have with your health care provider. Document Released: 04/14/2015 Document Revised: 12/06/2015 Document Reviewed: 01/17/2015 Elsevier Interactive Patient Education  2017 [employer] Prevention in the Home Falls can cause injuries. They can happen to people of all ages. There are many things you can do to make your home safe and to help prevent falls. What can I do on the outside of my home?  Regularly fix the edges of walkways and driveways and fix any cracks.  Remove anything that might make you trip as you walk through a door, such as a raised step or threshold.  Trim any bushes or trees on the path to your home.  Use bright outdoor lighting.  Clear any walking paths of anything that might make someone  trip, such as rocks or tools.  Regularly check to see if handrails are loose or broken. Make sure that both sides of any steps have handrails.  Any raised decks and porches should have guardrails on the edges.  Have any leaves, snow, or ice cleared regularly.  Use sand or salt on walking paths during winter.  Clean up any spills in your garage right away. This includes oil or grease spills. What can I do in the bathroom?  Use night lights.  Install grab bars by the toilet and in the tub and shower. Do not use towel bars as grab bars.  Use non-skid mats or decals in the tub or shower.  If you need to sit down in the shower, use a plastic, non-slip stool.  Keep the floor dry. Clean up any water that spills on the floor as soon as it happens.  Remove soap buildup in the tub or shower regularly.  Attach bath mats securely with double-sided non-slip rug tape.  Do not have throw rugs and other things on the floor that can make you trip. What can I do in the bedroom?  Use night lights.  Make sure that you have a light by your bed that is easy to reach.  Do not use any sheets or blankets that are too big for your bed. They should not hang down onto the  floor.  Have a firm chair that has side arms. You can use this for support while you get dressed.  Do not have throw rugs and other things on the floor that can make you trip. What can I do in the kitchen?  Clean up any spills right away.  Avoid walking on wet floors.  Keep items that you use a lot in easy-to-reach places.  If you need to reach something above you, use a strong step stool that has a grab bar.  Keep electrical cords out of the way.  Do not use floor polish or wax that makes floors slippery. If you must use wax, use non-skid floor wax.  Do not have throw rugs and other things on the floor that can make you trip. What can I do with my stairs?  Do not leave any items on the stairs.  Make sure that there  are handrails on both sides of the stairs and use them. Fix handrails that are broken or loose. Make sure that handrails are as long as the stairways.  Check any carpeting to make sure that it is firmly attached to the stairs. Fix any carpet that is loose or worn.  Avoid having throw rugs at the top or bottom of the stairs. If you do have throw rugs, attach them to the floor with carpet tape.  Make sure that you have a light switch at the top of the stairs and the bottom of the stairs. If you do not have them, ask someone to add them for you. What else can I do to help prevent falls?  Wear shoes that:  Do not have high heels.  Have rubber bottoms.  Are comfortable and fit you well.  Are closed at the toe. Do not wear sandals.  If you use a stepladder:  Make sure that it is fully opened. Do not climb a closed stepladder.  Make sure that both sides of the stepladder are locked into place.  Ask someone to hold it for you, if possible.  Clearly mark and make sure that you can see:  Any grab bars or handrails.  First and last steps.  Where the edge of each step is.  Use tools that help you move around (mobility aids) if they are needed. These include:  Canes.  Walkers.  Scooters.  Crutches.  Turn on the lights when you go into a dark area. Replace any light bulbs as soon as they burn out.  Set up your furniture so you have a clear path. Avoid moving your furniture around.  If any of your floors are uneven, fix them.  If there are any pets around you, be aware of where they are.  Review your medicines with your doctor. Some medicines can make you feel dizzy. This can increase your chance of falling. Ask your doctor what other things that you can do to help prevent falls. This information is not intended to replace advice given to you by your health care provider. Make sure you discuss any questions you have with your health care provider. Document Released:  01/12/2009 Document Revised: 08/24/2015 Document Reviewed: 04/22/2014 Elsevier Interactive Patient Education  2017 [employer].

## 2019-09-28 NOTE — Progress Notes (Addendum)
Subjective:  Patient ID: Devon Payne, male    DOB: 10-18-1950  Age: 69 y.o. MRN: [id number]  CC: Annual Exam, Hypertension, Hyperlipidemia, and Diabetes  This visit occurred during the SARS-CoV-2 public health emergency.  Safety protocols were in place, including screening questions prior to the visit, additional usage of staff PPE, and extensive cleaning of exam room while observing appropriate contact time as indicated for disinfecting solutions.    HPI Devon Payne presents for a CPX.  He is somewhat active and denies any recent episodes of chest pain, shortness of breath, palpitations, edema, or fatigue.  He does not monitor his blood pressure or his blood sugar.  He denies polys.  Outpatient Medications Prior to Visit  Medication Sig Dispense Refill  . aspirin EC 81 MG tablet Take 1 tablet (81 mg total) by mouth daily. 90 tablet 3  . hydrochlorothiazide (HYDRODIURIL) 25 MG tablet TAKE 1 TABLET BY MOUTH DAILY 90 tablet 1  . losartan (COZAAR) 100 MG tablet TAKE 1 TABLET BY MOUTH EVERY DAY 90 tablet 1  . pioglitazone (ACTOS) 15 MG tablet TAKE 1 TABLET BY MOUTH DAILY 90 tablet 1  . pravastatin (PRAVACHOL) 20 MG tablet TAKE 1 TABLET BY MOUTH EACH NIGHT AT BEDTIME 90 tablet 1  . SitaGLIPtin-MetFORMIN HCl (JANUMET XR) [phone] MG TB24 Take 1 tablet by mouth daily. 90 tablet 3   No facility-administered medications prior to visit.    You have an online test ROS Review of Systems  Constitutional: Negative.  Negative for appetite change, diaphoresis, fatigue and unexpected weight change.  HENT: Negative.   Eyes: Negative.   Respiratory: Negative for cough, chest tightness, shortness of breath and wheezing.   Cardiovascular: Negative for chest pain, palpitations and leg swelling.  Gastrointestinal: Negative for abdominal pain, constipation, diarrhea, nausea and vomiting.  Endocrine: Negative.   Genitourinary: Negative.  Negative for difficulty urinating, dysuria, scrotal swelling  and testicular pain.  Musculoskeletal: Negative.  Negative for myalgias.  Skin: Negative.   Neurological: Negative.  Negative for dizziness, weakness, light-headedness, numbness and headaches.  Hematological: Negative for adenopathy. Does not bruise/bleed easily.  Psychiatric/Behavioral: Negative.     Objective:  BP 140/84 (BP Location: Left Arm, Patient Position: Sitting, Cuff Size: Normal)   Pulse 90   Temp 98.2 F (36.8 C) (Oral)   Resp 16   Ht 5\' 7"  (1.702 m)   Wt 226 lb (102.5 kg)   SpO2 97%   BMI 35.40 kg/m   BP Readings from Last 3 Encounters:  09/28/19 140/84  09/28/19 130/80  04/05/19 (!) 147/94    Wt Readings from Last 3 Encounters:  09/28/19 226 lb (102.5 kg)  09/28/19 226 lb (102.5 kg)  04/05/19 226 lb 9.6 oz (102.8 kg)    Physical Exam Vitals reviewed. Exam conducted with a chaperone present Everette Rank).  Constitutional:      Appearance: He is obese. He is not diaphoretic.  HENT:     Nose: Nose normal.     Mouth/Throat:     Mouth: Mucous membranes are moist.     Pharynx: No oropharyngeal exudate.  Eyes:     General: No scleral icterus.    Conjunctiva/sclera: Conjunctivae normal.  Cardiovascular:     Rate and Rhythm: Normal rate and regular rhythm.     Heart sounds: No murmur heard.      Comments: EKG- NSR, 97 bpm No LVH Normal EKG Pulmonary:     Effort: Pulmonary effort is normal.     Breath sounds: No  stridor. No wheezing, rhonchi or rales.  Abdominal:     General: Abdomen is protuberant. Bowel sounds are normal. There is no distension.     Palpations: Abdomen is soft. There is no hepatomegaly, splenomegaly or mass.     Tenderness: There is no abdominal tenderness.     Hernia: No hernia is present. There is no hernia in the left inguinal area or right inguinal area.  Genitourinary:    Pubic Area: No rash.      Penis: Normal and circumcised. No discharge, swelling or lesions.      Testes: Normal.        Right: Mass, tenderness or  swelling not present.        Left: Mass, tenderness or swelling not present.     Epididymis:     Right: Normal. No mass.     Left: Normal. No mass.     Prostate: Enlarged (1+ smooth symm BPH). Not tender and no nodules present.     Rectum: Normal. Guaiac result negative. No mass, tenderness, anal fissure, external hemorrhoid or internal hemorrhoid. Normal anal tone.  Musculoskeletal:     Cervical back: Neck supple.     Right lower leg: No edema.     Left lower leg: No edema.  Lymphadenopathy:     Cervical: No cervical adenopathy.     Lower Body: No right inguinal adenopathy. No left inguinal adenopathy.  Skin:    General: Skin is warm and dry.     Findings: No rash.  Neurological:     General: No focal deficit present.     Mental Status: He is alert and oriented to person, place, and time. Mental status is at baseline.  Psychiatric:        Mood and Affect: Mood normal.        Behavior: Behavior normal.     Lab Results  Component Value Date   WBC 8.7 09/28/2019   HGB 15.0 09/28/2019   HCT 43.9 09/28/2019   PLT 261.0 09/28/2019   GLUCOSE 109 (H) 09/28/2019   CHOL 182 09/28/2019   TRIG 207.0 (H) 09/28/2019   HDL 48.80 09/28/2019   LDLDIRECT 110.0 09/28/2019   LDLCALC 107 (H) 06/13/2016   ALT 35 09/28/2019   AST 34 09/28/2019   NA 136 09/28/2019   K 4.7 09/28/2019   CL 97 09/28/2019   CREATININE 1.24 09/28/2019   BUN 22 09/28/2019   CO2 28 09/28/2019   TSH 1.49 09/28/2019   PSA 2.28 09/28/2019   HGBA1C 5.8 (A) 09/28/2019   MICROALBUR 1.7 09/28/2019    MR BRAIN W WO CONTRAST  Result Date: 04/01/2019 CLINICAL DATA:  Enhancing lesion conus medullaris. Rule out metastatic disease. EXAM: MRI HEAD WITHOUT AND WITH CONTRAST TECHNIQUE: Multiplanar, multiecho pulse sequences of the brain and surrounding structures were obtained without and with intravenous contrast. CONTRAST:  32mL MULTIHANCE GADOBENATE DIMEGLUMINE 529 MG/ML IV SOLN COMPARISON:  None. FINDINGS: Brain:  Generalized atrophy. Negative for infarct, hemorrhage, mass. No edema. No enhancing lesion identified Vascular: Normal arterial flow voids. Skull and upper cervical spine: No focal skeletal lesion. Sinuses/Orbits: Negative Other: None IMPRESSION: Generalized atrophy. No acute abnormality. No enhancing lesion identified in the brain. Electronically Signed   By: Franchot Gallo M.D.   On: 04/01/2019 15:11   MR CERVICAL SPINE W WO CONTRAST  Result Date: 04/01/2019 CLINICAL DATA:  Enhancing lesions conus medullaris. Rule out metastatic disease. Creatinine was obtained on site at [facility] at [address] Results: Creatinine 1.2  mg/dL. EXAM: MRI CERVICAL SPINE WITHOUT AND WITH CONTRAST TECHNIQUE: Multiplanar and multiecho pulse sequences of the cervical spine, to include the craniocervical junction and cervicothoracic junction, were obtained without and with intravenous contrast. CONTRAST:  45mL MULTIHANCE GADOBENATE DIMEGLUMINE 529 MG/ML IV SOLN COMPARISON:  None. FINDINGS: Alignment: Mild retrolisthesis C3-4.  2 mm retrolisthesis C4-5. Vertebrae: No vertebral mass or fracture.  Normal bone marrow Cord: Normal cord signal. No enhancing cord lesion identified. No cord compression. Posterior Fossa, vertebral arteries, paraspinal tissues: Negative Disc levels: C2-3: Negative C3-4: Mild left foraminal narrowing due to spurring and facet hypertrophy. C4-5: Small central disc protrusion. Mild left foraminal narrowing due to facet hypertrophy C5-6: Moderate right foraminal narrowing due to uncinate spurring. Left foramen patent. C6-7: Moderate left foraminal narrowing due to uncinate spurring and facet hypertrophy. No spinal stenosis C7-T1: Small central disc protrusion.  Negative for stenosis. IMPRESSION: Mild degenerative changes cervical spine Negative for mass lesion. Electronically Signed   By: Franchot Gallo M.D.   On: 04/01/2019 15:15   MR THORACIC SPINE W WO CONTRAST  Result Date:  04/01/2019 CLINICAL DATA:  Enhancing lesion conus medullaris. Creatinine was obtained on site at [facility] at [address] Results: Creatinine 1.2 mg/dL. EXAM: MRI THORACIC WITHOUT AND WITH CONTRAST TECHNIQUE: Multiplanar and multiecho pulse sequences of the thoracic spine were obtained without and with intravenous contrast. CONTRAST:  65mL MULTIHANCE GADOBENATE DIMEGLUMINE 529 MG/ML IV SOLN COMPARISON:  MRI lumbar spine 02/08/2019, 02/22/2019, 07/10/2010 FINDINGS: MRI THORACIC SPINE FINDINGS Alignment:  Mild anterolisthesis T2-3.  Accentuated dorsal kyphosis. Vertebrae: Negative for fracture. T5 and T10 hemangioma. No evidence of skeletal metastatic disease. Cord: Mild cord edema at T11-12. Small enhancing nodule within the cord measuring approximately 3 x 5 mm. There appears to be surrounding edema in the cord. The enhancing lesion is intramedullary. This is similar to the recent MRI. Review of the prior MRI from 2012 was done without contrast but does reveal edema in the cord at this level. The edema shows mild progression since 2012. Paraspinal and other soft tissues: Negative for paraspinous mass or adenopathy. Disc levels: Multilevel disc degeneration. Significant disc pathology described below T2-3: Moderately large central disc protrusion with cord flattening and mild spinal stenosis. T3-4: Small central disc protrusion with mild cord flattening T4-5: Small right paracentral disc protrusion with cord flattening on the right. T5-6: Mild right-sided disc protrusion with mild cord flattening T7-8: Moderate disc protrusion on the left with cord flattening on the left. Mild spinal stenosis T8-9: Mild to moderate left-sided disc protrusion with mild cord flattening T9-10: Small central disc protrusion.  Mild cord flattening IMPRESSION: 3 x 5 mm intramedullary enhancing lesion in the cord at T11-12. Mild surrounding edema in the cord. The edema has progressed since 2012. Prior MRI 2012 does not  include intravenous contrast. No other enhancing lesions identified. Given the indolent nature of this lesion, a benign lesion is favored. Consider vascular malformation such as cavernoma or hemangioblastoma. Low-grade tumor possible. Metastatic disease felt to be low in the differential. Electronically Signed   By: Franchot Gallo M.D.   On: 04/01/2019 15:32    Assessment & Plan:   Jesaiah was seen today for annual exam, hypertension, hyperlipidemia and diabetes.  Diagnoses and all orders for this visit:  Essential hypertension- His blood pressure is adequately well controlled.  Electrolytes and renal function are normal. -     CBC with Differential/Platelet; Future -     Basic metabolic panel; Future -  TSH; Future -     Urinalysis, Routine w reflex microscopic; Future -     losartan (COZAAR) 100 MG tablet; Take 1 tablet (100 mg total) by mouth daily. -     EKG 12-Lead -     hydrochlorothiazide (HYDRODIURIL) 25 MG tablet; Take 1 tablet (25 mg total) by mouth daily. -     TSH -     CBC with Differential/Platelet -     Basic metabolic panel -     Urinalysis, Routine w reflex microscopic  Nonalcoholic steatohepatitis (NASH)- His liver enzymes remain mildly elevated.  I recommended that he continue to work on his lifestyle modifications and stay on the current dose of pioglitazone. -     Hepatic function panel; Future -     Hepatic function panel  Type II diabetes mellitus with manifestations ([place])- His A1c is down to 5.8%.  I recommend that he stop taking Metformin and the DPP 4 inhibitor.  Will stay on the current dose of pioglitazone. -     Basic metabolic panel; Future -     Microalbumin / creatinine urine ratio; Future -     pioglitazone (ACTOS) 15 MG tablet; Take 1 tablet (15 mg total) by mouth daily. -     aspirin EC 81 MG tablet; Take 1 tablet (81 mg total) by mouth daily. -     HM Diabetes Foot Exam -     POCT glycosylated hemoglobin (Hb A1C) -     Basic metabolic panel -      Microalbumin / creatinine urine ratio  Benign prostatic hyperplasia without lower urinary tract symptoms- His PSA is normal which is a reassuring sign that he does not have prostate cancer.  He has no symptoms that need to be treated. -     PSA; Future -     Urinalysis, Routine w reflex microscopic; Future -     PSA -     Urinalysis, Routine w reflex microscopic  Hyperlipidemia with target LDL less than 100- He has achieved his LDL goal and is doing well on the statin. -     Lipid panel; Future -     Hepatic function panel; Future -     TSH; Future -     pravastatin (PRAVACHOL) 20 MG tablet; TAKE 1 TABLET BY MOUTH EACH NIGHT AT BEDTIME -     TSH -     Hepatic function panel -     Lipid panel  Routine general medical examination at a health care facility- Exam completed, labs reviewed, vaccines reviewed and updated, cancer screenings are up-to-date, patient education was given.  Fatty liver disease, nonalcoholic -     pioglitazone (ACTOS) 15 MG tablet; Take 1 tablet (15 mg total) by mouth daily.  Type 2 diabetes mellitus with complication, without long-term current use of insulin (HCC) -     pravastatin (PRAVACHOL) 20 MG tablet; TAKE 1 TABLET BY MOUTH EACH NIGHT AT BEDTIME -     losartan (COZAAR) 100 MG tablet; Take 1 tablet (100 mg total) by mouth daily.  Need for shingles vaccine -     Zoster Vaccine Adjuvanted [facility]) injection; Inject 0.5 mLs into the muscle once for 1 dose.  Other orders -     LDL cholesterol, direct   I have discontinued Dominica Severin L. Delconte's SitaGLIPtin-MetFORMIN HCl. I have also changed his pioglitazone, losartan, and hydrochlorothiazide. Additionally, I am having him start on Zoster Vaccine Adjuvanted. Lastly, I am having him maintain his pravastatin and aspirin  EC.  Meds ordered this encounter  Medications  . pioglitazone (ACTOS) 15 MG tablet    Sig: Take 1 tablet (15 mg total) by mouth daily.    Dispense:  90 tablet    Refill:  1  . pravastatin  (PRAVACHOL) 20 MG tablet    Sig: TAKE 1 TABLET BY MOUTH EACH NIGHT AT BEDTIME    Dispense:  90 tablet    Refill:  1  . losartan (COZAAR) 100 MG tablet    Sig: Take 1 tablet (100 mg total) by mouth daily.    Dispense:  90 tablet    Refill:  1  . aspirin EC 81 MG tablet    Sig: Take 1 tablet (81 mg total) by mouth daily.    Dispense:  90 tablet    Refill:  3  . Zoster Vaccine Adjuvanted [facility]) injection    Sig: Inject 0.5 mLs into the muscle once for 1 dose.    Dispense:  0.5 mL    Refill:  1  . hydrochlorothiazide (HYDRODIURIL) 25 MG tablet    Sig: Take 1 tablet (25 mg total) by mouth daily.    Dispense:  90 tablet    Refill:  1   In addition to time spent on CPE, I spent 50 minutes in preparing to see the patient by review of recent labs, imaging and procedures, obtaining and reviewing separately obtained history, communicating with the patient and family or caregiver, ordering medications, tests or procedures, and documenting clinical information in the EHR including the differential Dx, treatment, and any further evaluation and other management of 1. Essential hypertension 2. Nonalcoholic steatohepatitis (NASH) 3. Type II diabetes mellitus with manifestations ([place]) 4. Benign prostatic hyperplasia without lower urinary tract symptoms 5. Hyperlipidemia with target LDL less than 100 6. Fatty liver disease, nonalcoholic 7. Type 2 diabetes mellitus with complication, without long-term current use of insulin ([place])    Follow-up: Return in about 6 months (around 03/29/2020).  Scarlette Calico, MD

## 2019-12-08 DIAGNOSIS — D3131 Benign neoplasm of right choroid: Secondary | ICD-10-CM | POA: Diagnosis not present

## 2019-12-08 DIAGNOSIS — E1165 Type 2 diabetes mellitus with hyperglycemia: Secondary | ICD-10-CM | POA: Diagnosis not present

## 2019-12-08 DIAGNOSIS — H40053 Ocular hypertension, bilateral: Secondary | ICD-10-CM | POA: Diagnosis not present

## 2019-12-08 DIAGNOSIS — H359 Unspecified retinal disorder: Secondary | ICD-10-CM | POA: Diagnosis not present

## 2019-12-15 LAB — HM DIABETES EYE EXAM

## 2019-12-22 ENCOUNTER — Ambulatory Visit (INDEPENDENT_AMBULATORY_CARE_PROVIDER_SITE_OTHER): Payer: PPO

## 2019-12-22 ENCOUNTER — Other Ambulatory Visit: Payer: Self-pay | Admitting: Internal Medicine

## 2019-12-22 ENCOUNTER — Other Ambulatory Visit: Payer: Self-pay

## 2019-12-22 DIAGNOSIS — E118 Type 2 diabetes mellitus with unspecified complications: Secondary | ICD-10-CM

## 2019-12-22 DIAGNOSIS — E785 Hyperlipidemia, unspecified: Secondary | ICD-10-CM

## 2019-12-22 DIAGNOSIS — K760 Fatty (change of) liver, not elsewhere classified: Secondary | ICD-10-CM

## 2019-12-22 DIAGNOSIS — Z23 Encounter for immunization: Secondary | ICD-10-CM | POA: Diagnosis not present

## 2019-12-24 ENCOUNTER — Other Ambulatory Visit: Payer: Self-pay | Admitting: *Deleted

## 2019-12-24 DIAGNOSIS — D492 Neoplasm of unspecified behavior of bone, soft tissue, and skin: Secondary | ICD-10-CM

## 2020-01-07 IMAGING — MR MR HEAD WO/W CM
11 series · 48 of 48 positions shown · IV contrast (20 ml multihance)
Comparison: None.

CLINICAL DATA: Enhancing lesion conus medullaris. Rule out
metastatic disease.

EXAM:
MRI HEAD WITHOUT AND WITH CONTRAST
TECHNIQUE: Multiplanar, multiecho pulse sequences of the brain and surrounding
structures were obtained without and with intravenous contrast.
CONTRAST:  20mL MULTIHANCE GADOBENATE DIMEGLUMINE 529 MG/ML IV SOLN

[Series 5: T1 · sagittal · 4.0mm · 0.75mm/px · 2 of 29 slices shown (1 of 2)]
[im 1/29]
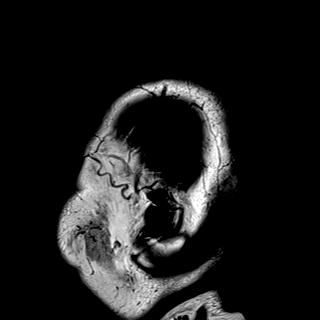
[im 29/29]
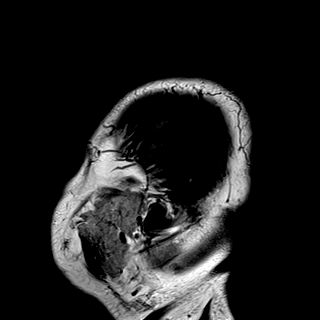

[Series 6: DWI · axial · 3.0mm · 1.44mm/px · z∈[-60,+91]mm · 6 of 84 slices shown (1 of 2)]
[im 1/84]
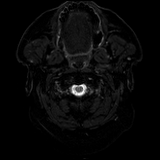
[im 17/84]
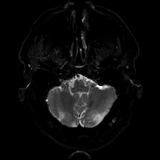
[im 34/84]
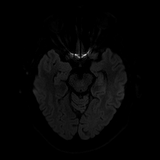
[im 50/84]
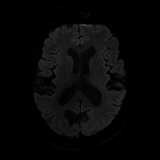
[im 67/84]
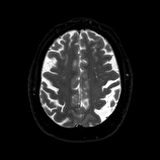
[im 84/84]
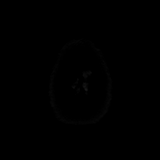

[Series 7: DWI · axial · 3.0mm · 1.44mm/px · z∈[-60,+91]mm · 3 of 42 slices shown (2 of 2)]
[im 1/42]
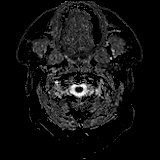
[im 21/42]
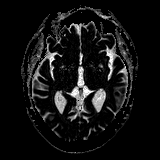
[im 42/42]
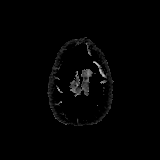

[Series 8: T2 · axial · 4.0mm · 0.36mm/px · z∈[-60,+94]mm · 2 of 29 slices shown]
[im 1/29]
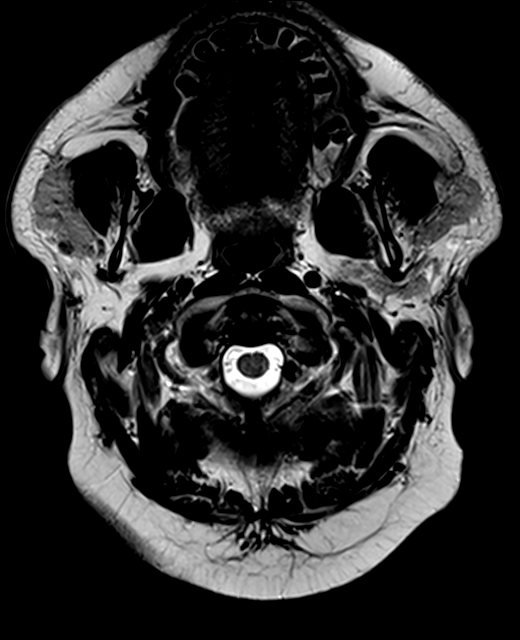
[im 29/29]
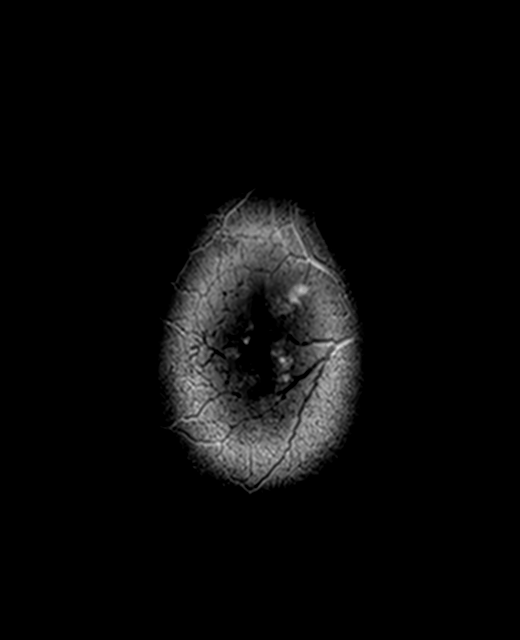

[Series 9: FLAIR · axial · 3.0mm · 0.72mm/px · z∈[-56,+91]mm · 2 of 26 slices shown]
[im 1/26]
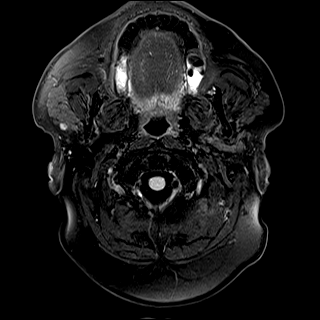
[im 26/26]
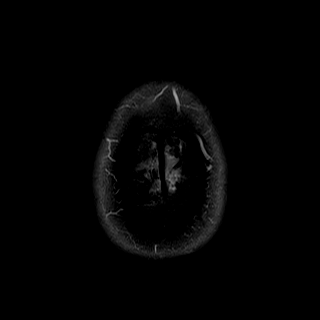

[Series 11: swi_images · axial · 2.0mm · 0.90mm/px · z∈[-60,+94]mm · 5 of 80 slices shown]
[im 1/80]
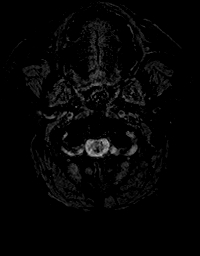
[im 20/80]
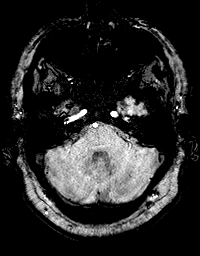
[im 40/80]
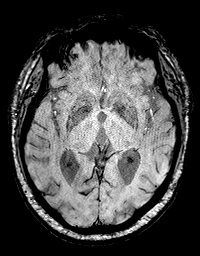
[im 60/80]
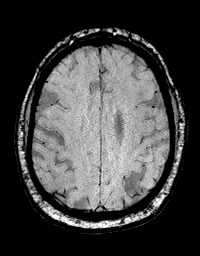
[im 80/80]
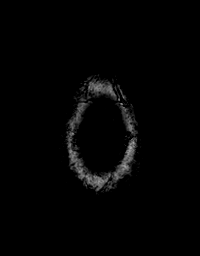

[Series 12: T1 · axial · 1.0mm · 0.94mm/px · z∈[-60,+96]mm · 11 of 160 slices shown (2 of 2)]
[im 1/160]
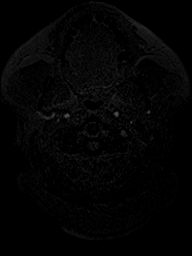
[im 16/160]
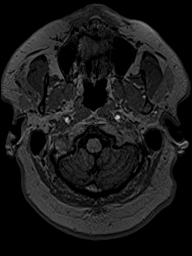
[im 32/160]
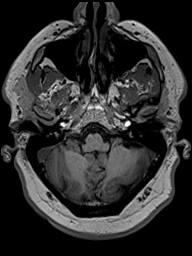
[im 48/160]
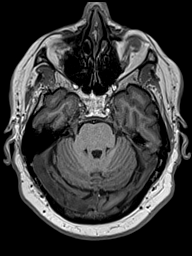
[im 64/160]
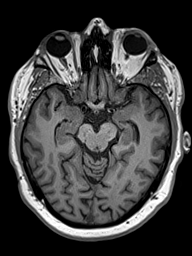
[im 80/160]
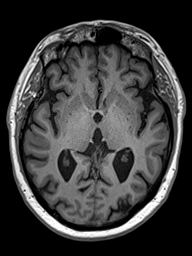
[im 96/160]
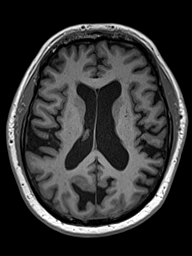
[im 112/160]
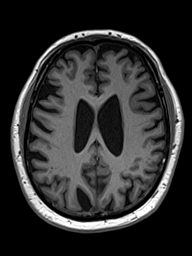
[im 128/160]
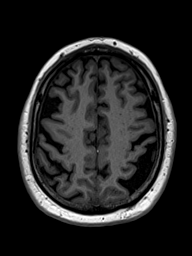
[im 144/160]
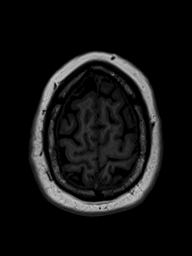
[im 160/160]
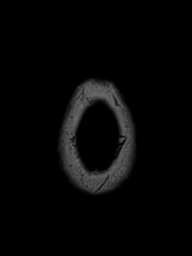

[Series 13: T2 post-contrast · coronal · 4.5mm · 0.36mm/px · 2 of 32 slices shown]
[im 1/32]
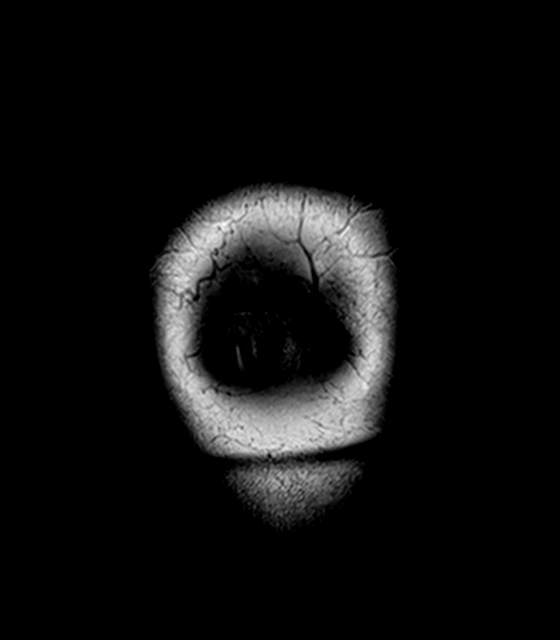
[im 32/32]
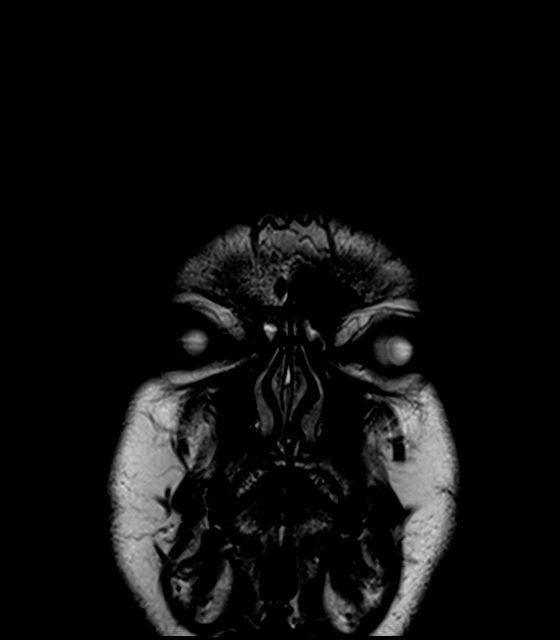

[Series 14: T1 post-contrast · axial · 1.0mm · 0.94mm/px · z∈[-60,+96]mm · 11 of 160 slices shown (1 of 3)]
[im 1/160]
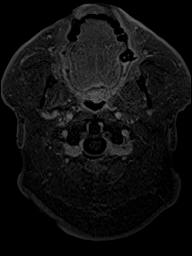
[im 16/160]
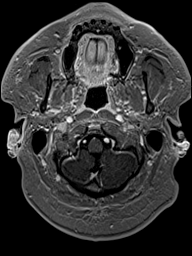
[im 32/160]
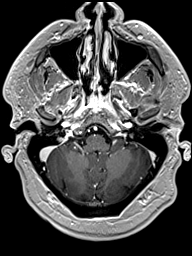
[im 48/160]
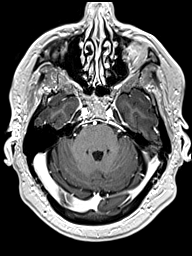
[im 64/160]
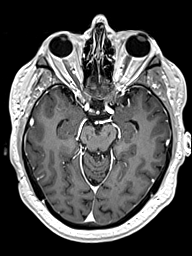
[im 80/160]
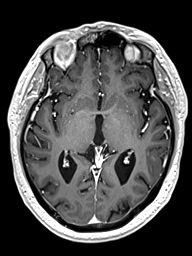
[im 96/160]
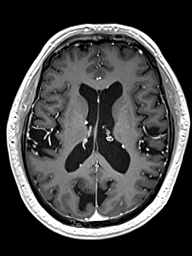
[im 112/160]
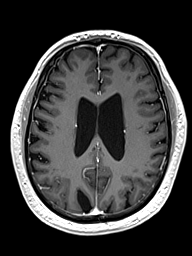
[im 128/160]
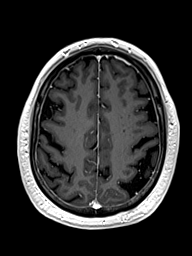
[im 144/160]
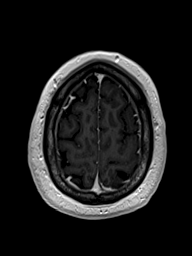
[im 160/160]
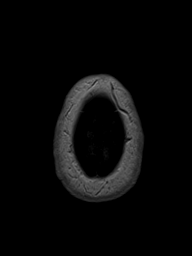

[Series 15: T1 post-contrast · coronal · 4.5mm · 0.72mm/px · 2 of 32 slices shown (2 of 3)]
[im 1/32]
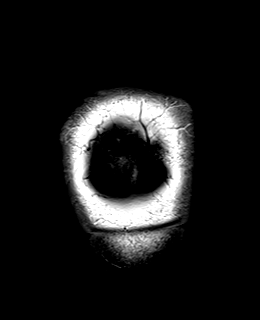
[im 32/32]
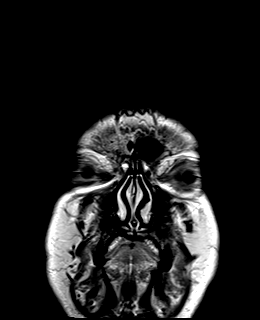

[Series 16: T1 post-contrast · sagittal · 4.0mm · 0.75mm/px · 2 of 29 slices shown (3 of 3)]
[im 1/29]
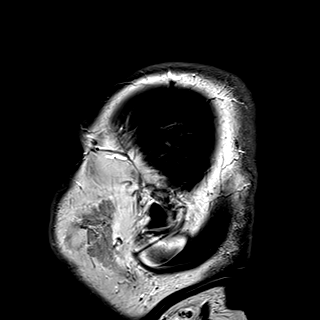
[im 29/29]
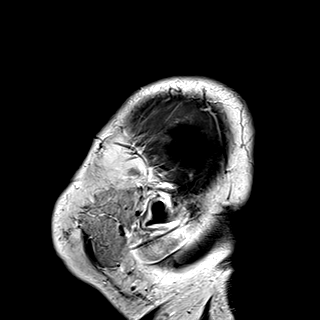

[48 of 48 positions shown; findings below may reference images not displayed]

FINDINGS: Brain: Generalized atrophy. Negative for infarct, hemorrhage, mass.
No edema.

No enhancing lesion identified

Vascular: Normal arterial flow voids.

Skull and upper cervical spine: No focal skeletal lesion.

Sinuses/Orbits: Negative

Other: None
IMPRESSION: Generalized atrophy. No acute abnormality. No enhancing lesion
identified in the brain.

## 2020-02-04 ENCOUNTER — Other Ambulatory Visit: Payer: Self-pay | Admitting: Radiation Therapy

## 2020-03-20 ENCOUNTER — Other Ambulatory Visit: Payer: Self-pay | Admitting: Family

## 2020-03-20 ENCOUNTER — Other Ambulatory Visit: Payer: Self-pay | Admitting: Internal Medicine

## 2020-03-20 DIAGNOSIS — E118 Type 2 diabetes mellitus with unspecified complications: Secondary | ICD-10-CM

## 2020-03-20 DIAGNOSIS — K760 Fatty (change of) liver, not elsewhere classified: Secondary | ICD-10-CM

## 2020-03-20 DIAGNOSIS — I10 Essential (primary) hypertension: Secondary | ICD-10-CM

## 2020-03-27 ENCOUNTER — Other Ambulatory Visit: Payer: PPO

## 2020-04-04 ENCOUNTER — Ambulatory Visit: Payer: Medicare Other | Admitting: Internal Medicine

## 2020-04-10 ENCOUNTER — Other Ambulatory Visit: Payer: Self-pay | Admitting: Internal Medicine

## 2020-04-10 DIAGNOSIS — I10 Essential (primary) hypertension: Secondary | ICD-10-CM

## 2020-05-04 ENCOUNTER — Other Ambulatory Visit: Payer: PPO

## 2020-05-08 ENCOUNTER — Inpatient Hospital Stay: Payer: PPO | Attending: Internal Medicine

## 2020-05-08 ENCOUNTER — Ambulatory Visit: Payer: PPO | Admitting: Internal Medicine

## 2020-05-09 ENCOUNTER — Other Ambulatory Visit: Payer: Self-pay | Admitting: Family

## 2020-05-09 DIAGNOSIS — E118 Type 2 diabetes mellitus with unspecified complications: Secondary | ICD-10-CM

## 2020-05-09 DIAGNOSIS — E785 Hyperlipidemia, unspecified: Secondary | ICD-10-CM

## 2020-05-31 ENCOUNTER — Encounter: Payer: Self-pay | Admitting: *Deleted

## 2020-06-19 ENCOUNTER — Other Ambulatory Visit: Payer: Self-pay | Admitting: Family

## 2020-06-19 ENCOUNTER — Other Ambulatory Visit: Payer: Self-pay | Admitting: Internal Medicine

## 2020-06-19 DIAGNOSIS — E118 Type 2 diabetes mellitus with unspecified complications: Secondary | ICD-10-CM

## 2020-06-19 DIAGNOSIS — K760 Fatty (change of) liver, not elsewhere classified: Secondary | ICD-10-CM

## 2020-06-19 DIAGNOSIS — I10 Essential (primary) hypertension: Secondary | ICD-10-CM

## 2020-06-20 ENCOUNTER — Other Ambulatory Visit: Payer: Self-pay | Admitting: Internal Medicine

## 2020-06-20 ENCOUNTER — Telehealth: Payer: Self-pay | Admitting: Internal Medicine

## 2020-06-20 DIAGNOSIS — K760 Fatty (change of) liver, not elsewhere classified: Secondary | ICD-10-CM

## 2020-06-20 DIAGNOSIS — E118 Type 2 diabetes mellitus with unspecified complications: Secondary | ICD-10-CM

## 2020-06-20 DIAGNOSIS — I10 Essential (primary) hypertension: Secondary | ICD-10-CM

## 2020-06-20 DIAGNOSIS — E785 Hyperlipidemia, unspecified: Secondary | ICD-10-CM

## 2020-06-20 MED ORDER — HYDROCHLOROTHIAZIDE 25 MG PO TABS: 25.0000 mg | ORAL_TABLET | Freq: Every day | ORAL | 0 refills | Status: DC

## 2020-06-20 MED ORDER — LOSARTAN POTASSIUM 100 MG PO TABS: 100.0000 mg | ORAL_TABLET | Freq: Every day | ORAL | 0 refills | Status: DC

## 2020-06-20 MED ORDER — PRAVASTATIN SODIUM 20 MG PO TABS: ORAL_TABLET | ORAL | 0 refills | Status: DC

## 2020-06-20 MED ORDER — PIOGLITAZONE HCL 15 MG PO TABS: 15.0000 mg | ORAL_TABLET | Freq: Every day | ORAL | 0 refills | Status: DC

## 2020-06-20 NOTE — Telephone Encounter (Signed)
Patient is requesting a short supply of  hydrochlorothiazide (HYDRODIURIL) 25 MG tablet  pioglitazone (ACTOS) 15 MG tablet  losartan (COZAAR) 100 MG tablet  Until he can see PCP on 07/06/20. Please advise    [place], [place] - [id number]

## 2020-07-05 ENCOUNTER — Other Ambulatory Visit: Payer: Self-pay

## 2020-07-06 ENCOUNTER — Ambulatory Visit (INDEPENDENT_AMBULATORY_CARE_PROVIDER_SITE_OTHER): Payer: PPO | Admitting: Internal Medicine

## 2020-07-06 ENCOUNTER — Other Ambulatory Visit: Payer: Self-pay

## 2020-07-06 ENCOUNTER — Encounter: Payer: Self-pay | Admitting: Internal Medicine

## 2020-07-06 VITALS — BP 138/76 | HR 86 | Temp 98.5°F | Ht 67.0 in | Wt 229.0 lb

## 2020-07-06 DIAGNOSIS — K7581 Nonalcoholic steatohepatitis (NASH): Secondary | ICD-10-CM

## 2020-07-06 DIAGNOSIS — E118 Type 2 diabetes mellitus with unspecified complications: Secondary | ICD-10-CM | POA: Diagnosis not present

## 2020-07-06 DIAGNOSIS — I10 Essential (primary) hypertension: Secondary | ICD-10-CM | POA: Diagnosis not present

## 2020-07-06 LAB — HEPATIC FUNCTION PANEL
ALT: 35 U/L (ref 0–53)
AST: 35 U/L (ref 0–37)
Albumin: 4.9 g/dL (ref 3.5–5.2)
Alkaline Phosphatase: 34 U/L — ABNORMAL LOW (ref 39–117)
Bilirubin, Direct: 0.2 mg/dL (ref 0.0–0.3)
Total Bilirubin: 0.9 mg/dL (ref 0.2–1.2)
Total Protein: 7.5 g/dL (ref 6.0–8.3)

## 2020-07-06 LAB — BASIC METABOLIC PANEL
BUN: 19 mg/dL (ref 6–23)
CO2: 30 mEq/L (ref 19–32)
Calcium: 10 mg/dL (ref 8.4–10.5)
Chloride: 98 mEq/L (ref 96–112)
Creatinine, Ser: 1.17 mg/dL (ref 0.40–1.50)
GFR: 63.29 mL/min (ref >60.00)
Glucose, Bld: 88 mg/dL (ref 70–99)
Potassium: 4.5 mEq/L (ref 3.5–5.1)
Sodium: 138 mEq/L (ref 135–145)

## 2020-07-06 LAB — PROTIME-INR
INR: 1.1 ratio — ABNORMAL HIGH (ref 0.8–1.0)
Prothrombin Time: 12.2 s (ref 9.6–13.1)

## 2020-07-06 LAB — HEMOGLOBIN A1C: Hgb A1c MFr Bld: 6.8 % — ABNORMAL HIGH (ref 4.6–6.5)

## 2020-07-06 MED ORDER — OLMESARTAN MEDOXOMIL 40 MG PO TABS: 40.0000 mg | ORAL_TABLET | Freq: Every day | ORAL | 1 refills | Status: DC

## 2020-07-06 NOTE — Patient Instructions (Signed)

## 2020-07-06 NOTE — Progress Notes (Signed)
Subjective:  Patient ID: Devon Payne, male    DOB: 21-Sep-1950  Age: 70 y.o. MRN: [id number]  CC: Hypertension and Diabetes  This visit occurred during the SARS-CoV-2 public health emergency.  Safety protocols were in place, including screening questions prior to the visit, additional usage of staff PPE, and extensive cleaning of exam room while observing appropriate contact time as indicated for disinfecting solutions.    HPI Devon Payne presents for f/up -  Tells me he walks about an hour a day.  He has good endurance.  He does not experience CP, DOE, palpitations, edema, or fatigue.  Outpatient Medications Prior to Visit  Medication Sig Dispense Refill  . aspirin EC 81 MG tablet Take 1 tablet (81 mg total) by mouth daily. 90 tablet 3  . hydrochlorothiazide (HYDRODIURIL) 25 MG tablet Take 1 tablet (25 mg total) by mouth daily. 30 tablet 0  . pioglitazone (ACTOS) 15 MG tablet Take 1 tablet (15 mg total) by mouth daily. 30 tablet 0  . pravastatin (PRAVACHOL) 20 MG tablet TAKE 1 TABLET BY MOUTH EVERY NIGHT AT BEDTIME 30 tablet 0  . losartan (COZAAR) 100 MG tablet Take 1 tablet (100 mg total) by mouth daily. 30 tablet 0   No facility-administered medications prior to visit.    ROS Review of Systems  Constitutional: Positive for unexpected weight change (wt gain). Negative for diaphoresis and fatigue.  HENT: Negative.   Respiratory: Negative.  Negative for cough, shortness of breath and wheezing.   Cardiovascular: Negative for chest pain, palpitations and leg swelling.  Gastrointestinal: Negative for abdominal pain, diarrhea, nausea and vomiting.  Endocrine: Negative.  Negative for polydipsia, polyphagia and polyuria.  Genitourinary: Negative.  Negative for difficulty urinating.  Musculoskeletal: Negative for arthralgias and myalgias.  Skin: Negative.  Negative for color change and pallor.  Neurological: Negative.  Negative for dizziness, weakness and light-headedness.   Hematological: Negative for adenopathy. Does not bruise/bleed easily.  Psychiatric/Behavioral: Negative.     Objective:  BP 138/76 (BP Location: Right Arm, Patient Position: Sitting, Cuff Size: Large)   Pulse 86   Temp 98.5 F (36.9 C) (Oral)   Ht 5\' 7"  (1.702 m)   Wt 229 lb (103.9 kg)   SpO2 98%   BMI 35.87 kg/m   BP Readings from Last 3 Encounters:  07/06/20 138/76  09/28/19 140/84  09/28/19 130/80    Wt Readings from Last 3 Encounters:  07/06/20 229 lb (103.9 kg)  09/28/19 226 lb (102.5 kg)  09/28/19 226 lb (102.5 kg)    Physical Exam Vitals reviewed.  HENT:     Nose: Nose normal.     Mouth/Throat:     Mouth: Mucous membranes are moist.  Eyes:     General: No scleral icterus.    Conjunctiva/sclera: Conjunctivae normal.  Cardiovascular:     Rate and Rhythm: Normal rate and regular rhythm.     Heart sounds: No murmur heard.   Pulmonary:     Effort: Pulmonary effort is normal.     Breath sounds: No stridor. No wheezing, rhonchi or rales.  Abdominal:     General: Abdomen is protuberant. Bowel sounds are normal. There is no distension.     Palpations: Abdomen is soft. There is no hepatomegaly, splenomegaly or mass.     Tenderness: There is no abdominal tenderness.  Musculoskeletal:        General: Normal range of motion.     Cervical back: Neck supple.     Right lower leg: No  edema.     Left lower leg: No edema.  Lymphadenopathy:     Cervical: No cervical adenopathy.  Skin:    General: Skin is warm and dry.  Neurological:     General: No focal deficit present.     Mental Status: He is alert.     Lab Results  Component Value Date   WBC 8.7 09/28/2019   HGB 15.0 09/28/2019   HCT 43.9 09/28/2019   PLT 261.0 09/28/2019   GLUCOSE 88 07/06/2020   CHOL 182 09/28/2019   TRIG 207.0 (H) 09/28/2019   HDL 48.80 09/28/2019   LDLDIRECT 110.0 09/28/2019   LDLCALC 107 (H) 06/13/2016   ALT 35 07/06/2020   AST 35 07/06/2020   NA 138 07/06/2020   K 4.5  07/06/2020   CL 98 07/06/2020   CREATININE 1.17 07/06/2020   BUN 19 07/06/2020   CO2 30 07/06/2020   TSH 1.49 09/28/2019   PSA 2.28 09/28/2019   INR 1.1 (H) 07/06/2020   HGBA1C 6.8 (H) 07/06/2020   MICROALBUR 1.7 09/28/2019    MR BRAIN W WO CONTRAST  Result Date: 04/01/2019 CLINICAL DATA:  Enhancing lesion conus medullaris. Rule out metastatic disease. EXAM: MRI HEAD WITHOUT AND WITH CONTRAST TECHNIQUE: Multiplanar, multiecho pulse sequences of the brain and surrounding structures were obtained without and with intravenous contrast. CONTRAST:  38mL MULTIHANCE GADOBENATE DIMEGLUMINE 529 MG/ML IV SOLN COMPARISON:  None. FINDINGS: Brain: Generalized atrophy. Negative for infarct, hemorrhage, mass. No edema. No enhancing lesion identified Vascular: Normal arterial flow voids. Skull and upper cervical spine: No focal skeletal lesion. Sinuses/Orbits: Negative Other: None IMPRESSION: Generalized atrophy. No acute abnormality. No enhancing lesion identified in the brain. Electronically Signed   By: Franchot Gallo M.D.   On: 04/01/2019 15:11   MR CERVICAL SPINE W WO CONTRAST  Result Date: 04/01/2019 CLINICAL DATA:  Enhancing lesions conus medullaris. Rule out metastatic disease. Creatinine was obtained on site at [place] at [address] Results: Creatinine 1.2 mg/dL. EXAM: MRI CERVICAL SPINE WITHOUT AND WITH CONTRAST TECHNIQUE: Multiplanar and multiecho pulse sequences of the cervical spine, to include the craniocervical junction and cervicothoracic junction, were obtained without and with intravenous contrast. CONTRAST:  30mL MULTIHANCE GADOBENATE DIMEGLUMINE 529 MG/ML IV SOLN COMPARISON:  None. FINDINGS: Alignment: Mild retrolisthesis C3-4.  2 mm retrolisthesis C4-5. Vertebrae: No vertebral mass or fracture.  Normal bone marrow Cord: Normal cord signal. No enhancing cord lesion identified. No cord compression. Posterior Fossa, vertebral arteries, paraspinal tissues: Negative Disc  levels: C2-3: Negative C3-4: Mild left foraminal narrowing due to spurring and facet hypertrophy. C4-5: Small central disc protrusion. Mild left foraminal narrowing due to facet hypertrophy C5-6: Moderate right foraminal narrowing due to uncinate spurring. Left foramen patent. C6-7: Moderate left foraminal narrowing due to uncinate spurring and facet hypertrophy. No spinal stenosis C7-T1: Small central disc protrusion.  Negative for stenosis. IMPRESSION: Mild degenerative changes cervical spine Negative for mass lesion. Electronically Signed   By: Franchot Gallo M.D.   On: 04/01/2019 15:15   MR THORACIC SPINE W WO CONTRAST  Result Date: 04/01/2019 CLINICAL DATA:  Enhancing lesion conus medullaris. Creatinine was obtained on site at [place] at [address] Results: Creatinine 1.2 mg/dL. EXAM: MRI THORACIC WITHOUT AND WITH CONTRAST TECHNIQUE: Multiplanar and multiecho pulse sequences of the thoracic spine were obtained without and with intravenous contrast. CONTRAST:  83mL MULTIHANCE GADOBENATE DIMEGLUMINE 529 MG/ML IV SOLN COMPARISON:  MRI lumbar spine 02/08/2019, 02/22/2019, 07/10/2010 FINDINGS: MRI THORACIC SPINE FINDINGS Alignment:  Mild anterolisthesis T2-3.  Accentuated dorsal kyphosis. Vertebrae: Negative for fracture. T5 and T10 hemangioma. No evidence of skeletal metastatic disease. Cord: Mild cord edema at T11-12. Small enhancing nodule within the cord measuring approximately 3 x 5 mm. There appears to be surrounding edema in the cord. The enhancing lesion is intramedullary. This is similar to the recent MRI. Review of the prior MRI from 2012 was done without contrast but does reveal edema in the cord at this level. The edema shows mild progression since 2012. Paraspinal and other soft tissues: Negative for paraspinous mass or adenopathy. Disc levels: Multilevel disc degeneration. Significant disc pathology described below T2-3: Moderately large central disc protrusion with cord  flattening and mild spinal stenosis. T3-4: Small central disc protrusion with mild cord flattening T4-5: Small right paracentral disc protrusion with cord flattening on the right. T5-6: Mild right-sided disc protrusion with mild cord flattening T7-8: Moderate disc protrusion on the left with cord flattening on the left. Mild spinal stenosis T8-9: Mild to moderate left-sided disc protrusion with mild cord flattening T9-10: Small central disc protrusion.  Mild cord flattening IMPRESSION: 3 x 5 mm intramedullary enhancing lesion in the cord at T11-12. Mild surrounding edema in the cord. The edema has progressed since 2012. Prior MRI 2012 does not include intravenous contrast. No other enhancing lesions identified. Given the indolent nature of this lesion, a benign lesion is favored. Consider vascular malformation such as cavernoma or hemangioblastoma. Low-grade tumor possible. Metastatic disease felt to be low in the differential. Electronically Signed   By: Franchot Gallo M.D.   On: 04/01/2019 15:32    Assessment & Plan:   Camara was seen today for hypertension and diabetes.  Diagnoses and all orders for this visit:  Type II diabetes mellitus with manifestations ([place])- His A1c is up to 6.8%.  I recommended that he start taking a GLP-1 agonist. -     Basic metabolic panel; Future -     Hemoglobin A1c; Future -     Hemoglobin A1c -     Basic metabolic panel -     olmesartan (BENICAR) 40 MG tablet; Take 1 tablet (40 mg total) by mouth daily.  Essential hypertension- His blood pressure is not adequately well controlled.  I recommended that he upgrade to a more potent ARB. -     Basic metabolic panel; Future -     Basic metabolic panel -     olmesartan (BENICAR) 40 MG tablet; Take 1 tablet (40 mg total) by mouth daily.  Nonalcoholic steatohepatitis ([facility])- His liver enzymes remain mildly elevated.  I encouraged him to continue working on his lifestyle modifications. -     Basic metabolic panel;  Future -     Hepatic function panel; Future -     Protime-INR; Future -     Protime-INR -     Hepatic function panel -     Basic metabolic panel   I have discontinued Dominica Severin L. Tucholski's losartan. I am also having him start on olmesartan and Rybelsus. Additionally, I am having him maintain his aspirin EC, hydrochlorothiazide, pioglitazone, and pravastatin.  Meds ordered this encounter  Medications  . olmesartan (BENICAR) 40 MG tablet    Sig: Take 1 tablet (40 mg total) by mouth daily.    Dispense:  90 tablet    Refill:  1  . Semaglutide (RYBELSUS) 3 MG TABS    Sig: Take 3 mg by mouth daily.    Dispense:  30 tablet    Refill:  0     Follow-up: Return in about 6 months (around 01/05/2021).  Scarlette Calico, MD

## 2020-07-07 MED ORDER — RYBELSUS 3 MG PO TABS
3.0000 mg | ORAL_TABLET | Freq: Every day | ORAL | 0 refills | Status: DC
Start: 2020-07-07 — End: 2020-07-26

## 2020-07-25 ENCOUNTER — Encounter: Payer: Self-pay | Admitting: Internal Medicine

## 2020-07-26 NOTE — Addendum Note (Signed)
Addended by: Janith Lima on: 07/26/2020 07:52 AM   Modules accepted: Orders

## 2020-07-28 ENCOUNTER — Other Ambulatory Visit: Payer: Self-pay | Admitting: Internal Medicine

## 2020-07-28 DIAGNOSIS — E118 Type 2 diabetes mellitus with unspecified complications: Secondary | ICD-10-CM

## 2020-07-28 DIAGNOSIS — K760 Fatty (change of) liver, not elsewhere classified: Secondary | ICD-10-CM

## 2020-08-07 ENCOUNTER — Other Ambulatory Visit: Payer: Self-pay | Admitting: Family

## 2020-08-07 ENCOUNTER — Other Ambulatory Visit: Payer: Self-pay | Admitting: Internal Medicine

## 2020-08-07 ENCOUNTER — Encounter: Payer: Self-pay | Admitting: Internal Medicine

## 2020-08-07 DIAGNOSIS — E785 Hyperlipidemia, unspecified: Secondary | ICD-10-CM

## 2020-08-07 DIAGNOSIS — I10 Essential (primary) hypertension: Secondary | ICD-10-CM

## 2020-08-07 DIAGNOSIS — E118 Type 2 diabetes mellitus with unspecified complications: Secondary | ICD-10-CM

## 2020-08-07 NOTE — Telephone Encounter (Signed)
Pt was last seen on 07/06/20. Only a 30 day supply of medication was sent. Okay to refill?

## 2020-08-08 ENCOUNTER — Other Ambulatory Visit: Payer: Self-pay | Admitting: Internal Medicine

## 2020-08-08 DIAGNOSIS — E118 Type 2 diabetes mellitus with unspecified complications: Secondary | ICD-10-CM

## 2020-08-16 ENCOUNTER — Encounter: Payer: Self-pay | Admitting: Internal Medicine

## 2020-08-16 ENCOUNTER — Other Ambulatory Visit: Payer: Self-pay | Admitting: Internal Medicine

## 2020-08-16 DIAGNOSIS — I10 Essential (primary) hypertension: Secondary | ICD-10-CM

## 2020-08-16 MED ORDER — INDAPAMIDE 1.25 MG PO TABS: 1.2500 mg | ORAL_TABLET | Freq: Every day | ORAL | 1 refills | Status: DC

## 2020-08-17 MED ORDER — INDAPAMIDE 1.25 MG PO TABS: 1.2500 mg | ORAL_TABLET | Freq: Every day | ORAL | 1 refills | Status: DC

## 2020-09-07 DIAGNOSIS — I10 Essential (primary) hypertension: Secondary | ICD-10-CM | POA: Diagnosis not present

## 2020-09-07 DIAGNOSIS — U071 COVID-19: Secondary | ICD-10-CM | POA: Diagnosis not present

## 2020-09-07 DIAGNOSIS — R059 Cough, unspecified: Secondary | ICD-10-CM | POA: Diagnosis not present

## 2020-09-07 DIAGNOSIS — Z20822 Contact with and (suspected) exposure to covid-19: Secondary | ICD-10-CM | POA: Diagnosis not present

## 2020-09-08 ENCOUNTER — Encounter: Payer: Self-pay | Admitting: Internal Medicine

## 2020-09-08 ENCOUNTER — Other Ambulatory Visit: Payer: Self-pay | Admitting: Internal Medicine

## 2020-09-08 DIAGNOSIS — U071 COVID-19: Secondary | ICD-10-CM | POA: Insufficient documentation

## 2020-09-08 MED ORDER — PAXLOVID 20 X 150 MG & 10 X 100MG PO TBPK: 3.0000 | ORAL_TABLET | Freq: Two times a day (BID) | ORAL | 0 refills | Status: AC

## 2020-09-08 MED ORDER — PROMETHAZINE HCL 12.5 MG PO TABS: 12.5000 mg | ORAL_TABLET | Freq: Four times a day (QID) | ORAL | 0 refills | Status: DC | PRN

## 2020-11-07 ENCOUNTER — Other Ambulatory Visit: Payer: Self-pay | Admitting: Internal Medicine

## 2020-11-07 DIAGNOSIS — I10 Essential (primary) hypertension: Secondary | ICD-10-CM

## 2020-12-23 ENCOUNTER — Emergency Department (HOSPITAL_BASED_OUTPATIENT_CLINIC_OR_DEPARTMENT_OTHER)
Admission: EM | Admit: 2020-12-23 | Discharge: 2020-12-23 | Disposition: A | Discharge status: Home / Self Care | Payer: PPO | Attending: Emergency Medicine | Admitting: Emergency Medicine

## 2020-12-23 ENCOUNTER — Other Ambulatory Visit: Payer: Self-pay

## 2020-12-23 ENCOUNTER — Encounter (HOSPITAL_BASED_OUTPATIENT_CLINIC_OR_DEPARTMENT_OTHER): Payer: Self-pay | Admitting: Emergency Medicine

## 2020-12-23 ENCOUNTER — Emergency Department (HOSPITAL_BASED_OUTPATIENT_CLINIC_OR_DEPARTMENT_OTHER): Payer: PPO

## 2020-12-23 DIAGNOSIS — S6992XA Unspecified injury of left wrist, hand and finger(s), initial encounter: Secondary | ICD-10-CM | POA: Diagnosis present

## 2020-12-23 DIAGNOSIS — S52572A Other intraarticular fracture of lower end of left radius, initial encounter for closed fracture: Secondary | ICD-10-CM | POA: Insufficient documentation

## 2020-12-23 DIAGNOSIS — I10 Essential (primary) hypertension: Secondary | ICD-10-CM | POA: Insufficient documentation

## 2020-12-23 DIAGNOSIS — S0081XA Abrasion of other part of head, initial encounter: Secondary | ICD-10-CM

## 2020-12-23 DIAGNOSIS — Z79899 Other long term (current) drug therapy: Secondary | ICD-10-CM | POA: Diagnosis not present

## 2020-12-23 DIAGNOSIS — Z7982 Long term (current) use of aspirin: Secondary | ICD-10-CM | POA: Diagnosis not present

## 2020-12-23 DIAGNOSIS — E119 Type 2 diabetes mellitus without complications: Secondary | ICD-10-CM | POA: Insufficient documentation

## 2020-12-23 DIAGNOSIS — Y92480 Sidewalk as the place of occurrence of the external cause: Secondary | ICD-10-CM | POA: Diagnosis not present

## 2020-12-23 DIAGNOSIS — W108XXA Fall (on) (from) other stairs and steps, initial encounter: Secondary | ICD-10-CM | POA: Diagnosis not present

## 2020-12-23 DIAGNOSIS — S52502A Unspecified fracture of the lower end of left radius, initial encounter for closed fracture: Secondary | ICD-10-CM | POA: Diagnosis not present

## 2020-12-23 DIAGNOSIS — Z8616 Personal history of COVID-19: Secondary | ICD-10-CM | POA: Diagnosis not present

## 2020-12-23 DIAGNOSIS — M25532 Pain in left wrist: Secondary | ICD-10-CM | POA: Insufficient documentation

## 2020-12-23 MED ORDER — HYDROCODONE-ACETAMINOPHEN 5-325 MG PO TABS: 1.0000 | ORAL_TABLET | Freq: Four times a day (QID) | ORAL | 0 refills | Status: DC | PRN

## 2020-12-23 NOTE — Discharge Instructions (Signed)
1.  Elevate your wrist is much as possible and apply well wrapped ice pack. 2.  Schedule a follow-up appointment with Dr. Mingo Amber. 3.  You may take Aleve per package instructions and Vicodin 1 to 2 tablets every 6 hours if needed for additional pain control.

## 2020-12-23 NOTE — ED Notes (Signed)
Patient reports left wrist pain s/p ground level fall at home, swelling noted to left wrist, no obvious deformity noted.

## 2020-12-23 NOTE — ED Triage Notes (Signed)
Pt fell at home, missed step and fell forward onto a sidewalk.  Pt has abrasions to both palms, and under nose.  Pt also has pain and swelling to left wrist.

## 2020-12-29 NOTE — ED Provider Notes (Signed)
[facility] Provider Note   CSN: [id number] Arrival date & time: 12/23/20  1049     History Chief Complaint  Patient presents with   Devon Payne is a 70 y.o. male.  HPI Patient had a misstep and fell forward onto the sidewalk.  He reports most of the pain is in his left wrist.  It is very swollen and hurts a lot if he moves it.  Patient is left-hand dominant he reports minor abrasions to his palms and just under his nose.  Patient denies any loss of consciousness.  No nasal bleeding.  No headache.  No dental pain no neck pain.  He reports he skinned his knees a little bit but no difficulty walking or bearing weight.    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   Lumbar degenerative disc disease 54/27/0623   Nonalcoholic steatohepatitis ([facility]) 09/22/2018   Routine general medical examination at a health care facility 06/16/2016   Benign prostatic hyperplasia without lower urinary tract symptoms 06/13/2016   Type II diabetes mellitus with manifestations ([place]) 11/02/2014   Hyperlipidemia with target LDL less than 100 11/02/2014   Essential hypertension 11/02/2014    Past Surgical History:  Procedure Laterality Date   [place] FUSION/FORAMINOTOMY  04/26/2011   Procedure: POSTERIOR CERVICAL FUSION/FORAMINOTOMY LEVEL 3;  Surgeon: Elaina Hoops, MD;  Location: [facility];  Service: Neurosurgery;  Laterality: Bilateral;  Right Cervical five-six,, Left Cervical six-seven, Right Cervical seven thoracic one Posterior Cervical Foraminotomy/ Diskectomy 3hrs Rm 33 (to follow)       Family History  Problem  Relation Age of Onset   Cancer Mother        breast   Heart disease Father    Diabetes Father    Diabetes Other    Alcohol abuse Neg Hx    COPD Neg Hx    Early death Neg Hx    Hearing loss Neg Hx    Hyperlipidemia Neg Hx    Hypertension Neg Hx    Kidney disease Neg Hx    Stroke Neg Hx     Social History   Tobacco Use   Smoking status: Never   Smokeless tobacco: Never  Substance Use Topics   Alcohol use: No   Drug use: No    Home Medications Prior to Admission medications   Medication Sig Start Date End Date Taking? Authorizing Provider  HYDROcodone-acetaminophen (NORCO/VICODIN) 5-325 MG tablet Take 1-2 tablets by mouth every 6 (six) hours as needed for moderate pain or severe pain. 12/23/20  Yes Charlesetta Shanks, MD  aspirin EC 81 MG tablet Take 1 tablet (81 mg total) by mouth daily. 09/28/19   Janith Lima, MD  indapamide (LOZOL) 1.25 MG tablet TAKE 1 TABLET BY MOUTH EACH DAY 11/07/20   Janith Lima, MD  olmesartan (BENICAR) 40 MG tablet Take 1 tablet (40 mg total) by mouth daily. 07/06/20   Janith Lima, MD  pioglitazone (ACTOS) 15 MG tablet TAKE 1 TABLET BY MOUTH EVERY DAY 07/28/20   Janith Lima, MD  pravastatin (PRAVACHOL)  20 MG tablet TAKE 1 TABLET BY MOUTH EVERY NIGHT AT BEDTIME 08/07/20   Janith Lima, MD  promethazine (PHENERGAN) 12.5 MG tablet Take 1 tablet (12.5 mg total) by mouth every 6 (six) hours as needed for nausea or vomiting. 09/08/20   Janith Lima, MD    Allergies    Metformin and related, Atorvastatin, Penicillins, Clindamycin, and Amoxicillin  Review of Systems   Review of Systems Constitutional: No fever no chills no malaise Neurologic: No headache no blurred vision no confusion Physical Exam Updated Vital Signs BP (!) 162/99 (BP Location: Right Arm)   Pulse 90   Temp 98.3 F (36.8 C) (Oral)   Resp 20   Ht 5\' 7"  (1.702 m)   Wt 103 kg   SpO2 97%   BMI 35.55 kg/m   Physical Exam Constitutional:      Comments: Alert nontoxic well  in appearance.  Clear mental status  HENT:     Head:     Comments: Superficial abrasion of the upper lip just below the nose.  Approximately half centimeter diameter.  No significant hematoma or general swelling.  No other facial contusions or abrasions    Nose: Nose normal.     Mouth/Throat:     Mouth: Mucous membranes are moist.     Pharynx: Oropharynx is clear.  Eyes:     Extraocular Movements: Extraocular movements intact.  Pulmonary:     Effort: Pulmonary effort is normal.  Chest:     Chest wall: No tenderness.  Abdominal:     General: There is no distension.     Palpations: Abdomen is soft.  Musculoskeletal:     Cervical back: Neck supple.     Comments: Moderate swelling of the left wrist with pain with any range of motion.  Patient can move digits normally.  Radial pulse 2+ and strong.  Hand warm and dry.  Very superficial abrasions to both palms.  Normal range of motion of the knees and hips  Skin:    General: Skin is warm and dry.  Neurological:     General: No focal deficit present.     Mental Status: He is oriented to person, place, and time.     Coordination: Coordination normal.  Psychiatric:        Mood and Affect: Mood normal.    ED Results / Procedures / Treatments   Labs (all labs ordered are listed, but only abnormal results are displayed) Labs Reviewed - No data to display  EKG None  Radiology No results found.  Procedures .Ortho Injury Treatment  Date/Time: 12/29/2020 8:14 AM Performed by: Charlesetta Shanks, MD Authorized by: Charlesetta Shanks, MD   Consent:    Consent obtained:  Verbal   Consent given by:  Patient   Risks discussed:  FractureInjury location: wrist Location details: left wrist Injury type: fracture Fracture type: distal radius Pre-procedure distal perfusion: normal Pre-procedure neurological function: normal Pre-procedure range of motion: reduced Manipulation performed: no Immobilization: splint Splint type: volar short  arm Splint Applied by: ED Tech Supplies used: Ortho-Glass Post-procedure neurovascular assessment: post-procedure neurovascularly intact Post-procedure distal perfusion: normal Post-procedure neurological function: normal Post-procedure range of motion: unchanged Comments: Splint placement good.  Patient is comfortable.  Neurovascularly intact     Medications Ordered in ED Medications - No data to display  ED Course  I have reviewed the triage vital signs and the nursing notes.  Pertinent labs & imaging results that were available during my care of the patient were reviewed  by me and considered in my medical decision making (see chart for details).    MDM Rules/Calculators/A&P                          Patient had mechanical fall.  Distal left wrist fracture.  Patient neurovascularly intact.  Patient splinted and comfortable.  Minor abrasion to upper lip but no significant facial trauma or head injury.  Follow-up plan and home management reviewed. Final Clinical Impression(s) / ED Diagnoses Final diagnoses:  Other closed intra-articular fracture of distal end of left radius, initial encounter  Abrasion of face, initial encounter    Rx / DC Orders ED Discharge Orders          Ordered    HYDROcodone-acetaminophen (NORCO/VICODIN) 5-325 MG tablet  Every 6 hours PRN        12/23/20 1415             Charlesetta Shanks, MD 12/29/20 [phone]

## 2021-01-01 ENCOUNTER — Telehealth: Payer: Self-pay | Admitting: Internal Medicine

## 2021-01-01 NOTE — Telephone Encounter (Signed)
Left message for patient to call me back at [phone] to schedule Medicare Annual Wellness Visit   Last AWV  09/28/19  Please schedule at anytime with [facility]-Nurse Health Advisor if patient calls the office back.    40 Minutes appointment   Any questions, please call me at [phone]  

## 2021-01-03 ENCOUNTER — Ambulatory Visit (INDEPENDENT_AMBULATORY_CARE_PROVIDER_SITE_OTHER): Payer: PPO | Admitting: Plastic Surgery

## 2021-01-03 ENCOUNTER — Ambulatory Visit (HOSPITAL_BASED_OUTPATIENT_CLINIC_OR_DEPARTMENT_OTHER)
Admission: RE | Admit: 2021-01-03 | Discharge: 2021-01-03 | Disposition: A | Discharge status: Home / Self Care | Payer: PPO | Source: Ambulatory Visit | Attending: Plastic Surgery | Admitting: Plastic Surgery

## 2021-01-03 ENCOUNTER — Other Ambulatory Visit: Payer: Self-pay

## 2021-01-03 DIAGNOSIS — S52502A Unspecified fracture of the lower end of left radius, initial encounter for closed fracture: Secondary | ICD-10-CM | POA: Diagnosis not present

## 2021-01-03 DIAGNOSIS — S52572A Other intraarticular fracture of lower end of left radius, initial encounter for closed fracture: Secondary | ICD-10-CM | POA: Diagnosis not present

## 2021-01-03 NOTE — Progress Notes (Signed)
Referring Provider Janith Lima, MD [place],  [place] [id number]   CC:  Chief Complaint  Patient presents with   Follow-up      Devon Payne is an 70 y.o. male.  HPI: Patient presents as a referral from the emergency room for a left distal radius fracture.  This occurred after ground-level fall.  He was seen in the emergency room and x-rays showed mildly comminuted minimally displaced intra-articular distal radius fracture.  He reports good sensation and movement in his fingers.  He has been in a volar wrist splint.  He is here to be evaluated.  Allergies  Allergen Reactions   Metformin And Related Diarrhea   Atorvastatin Other (See Comments)    Muscle aches   Penicillins Rash   Clindamycin Other (See Comments)    Had c-diff after taking medication Had c-diff after taking medication    Amoxicillin Rash    Outpatient Encounter Medications as of 01/03/2021  Medication Sig   aspirin EC 81 MG tablet Take 1 tablet (81 mg total) by mouth daily.   HYDROcodone-acetaminophen (NORCO/VICODIN) 5-325 MG tablet Take 1-2 tablets by mouth every 6 (six) hours as needed for moderate pain or severe pain.   indapamide (LOZOL) 1.25 MG tablet TAKE 1 TABLET BY MOUTH EACH DAY   pioglitazone (ACTOS) 15 MG tablet TAKE 1 TABLET BY MOUTH EVERY DAY   pravastatin (PRAVACHOL) 20 MG tablet TAKE 1 TABLET BY MOUTH EVERY NIGHT AT BEDTIME   [DISCONTINUED] olmesartan (BENICAR) 40 MG tablet Take 1 tablet (40 mg total) by mouth daily.   [DISCONTINUED] promethazine (PHENERGAN) 12.5 MG tablet Take 1 tablet (12.5 mg total) by mouth every 6 (six) hours as needed for nausea or vomiting.   No facility-administered encounter medications on file as of 01/03/2021.     Past Medical History:  Diagnosis Date   Chronic kidney disease    kidney stones   Diabetes mellitus without complication ([place])    History of chicken pox    Hypertension     Past Surgical History:  Procedure Laterality Date    KIDNEY STONE SURGERY  1990   POSTERIOR CERVICAL FUSION/FORAMINOTOMY  04/26/2011   Procedure: POSTERIOR CERVICAL FUSION/FORAMINOTOMY LEVEL 3;  Surgeon: Elaina Hoops, MD;  Location: [facility];  Service: Neurosurgery;  Laterality: Bilateral;  Right Cervical five-six,, Left Cervical six-seven, Right Cervical seven thoracic one Posterior Cervical Foraminotomy/ Diskectomy 3hrs Rm 33 (to follow)    Family History  Problem Relation Age of Onset   Cancer Mother        breast   Heart disease Father    Diabetes Father    Diabetes Other    Alcohol abuse Neg Hx    COPD Neg Hx    Early death Neg Hx    Hearing loss Neg Hx    Hyperlipidemia Neg Hx    Hypertension Neg Hx    Kidney disease Neg Hx    Stroke Neg Hx     Social History   Social History Narrative   Not on file     Review of Systems General: Denies fevers, chills, weight loss CV: Denies chest pain, shortness of breath, palpitations  Physical Exam Vitals with BMI 12/23/2020 07/06/2020 09/28/2019  Height 5\' 7"  5\' 7"  5\' 7"   Weight 227 lbs 229 lbs 226 lbs  BMI 35.55 09.98 33.82  Systolic 505 397 673  Diastolic 99 76 84  Pulse 90 86 90    General:  No acute distress,  Alert and oriented, Non-Toxic, Normal speech and affect Left hand: Left hand is splinted.  Fingers well-perfused with normal sensation.  Good flexion extension of all fingers and thumb.  Original x-ray was reviewed showing minimally displaced comminuted intra-articular fracture and volar involving the dorsal articular surface.  Minimal shortening.  Less than 10 degrees of dorsal tilt.  X-ray from today was also reviewed showing minimal to no change.  There is also a small triquetral chip fracture.  Assessment/Plan Patient presents with left distal radius fracture.  Overall alignment is stable over the past week and a half.  We discussed operative and nonoperative management.  We discussed the risks and benefits of each path.  Ultimately I recommended nonoperative  treatment of this fracture.  I do expect after around 6 weeks of immobilization that he will go on to do fine.  We may shoot another x-ray at his next visit which will be in 3 weeks.  We will plan to have hand therapy fit him for a volar wrist splint for immobilization which I would like to him to keep on at all times.  All this was discussed with him and he is on the same page.  All of his questions were answered.  Cindra Presume 01/03/2021, 9:50 PM

## 2021-01-05 ENCOUNTER — Encounter: Payer: Self-pay | Admitting: Plastic Surgery

## 2021-01-05 NOTE — Telephone Encounter (Signed)
Called and spoke with the patient regarding the MyChart message.  Informed the patient that the referral to has been placed to [facility] and they will be given him a call regarding his appointment with them.  Patient verbalized understand and agreed.//AB/CMA

## 2021-01-09 ENCOUNTER — Other Ambulatory Visit: Payer: Self-pay | Admitting: Internal Medicine

## 2021-01-09 DIAGNOSIS — E785 Hyperlipidemia, unspecified: Secondary | ICD-10-CM

## 2021-01-09 DIAGNOSIS — K760 Fatty (change of) liver, not elsewhere classified: Secondary | ICD-10-CM

## 2021-01-09 DIAGNOSIS — E118 Type 2 diabetes mellitus with unspecified complications: Secondary | ICD-10-CM

## 2021-01-10 ENCOUNTER — Other Ambulatory Visit: Payer: Self-pay | Admitting: Internal Medicine

## 2021-01-10 DIAGNOSIS — K760 Fatty (change of) liver, not elsewhere classified: Secondary | ICD-10-CM

## 2021-01-10 DIAGNOSIS — E785 Hyperlipidemia, unspecified: Secondary | ICD-10-CM

## 2021-01-10 DIAGNOSIS — E118 Type 2 diabetes mellitus with unspecified complications: Secondary | ICD-10-CM

## 2021-01-10 MED ORDER — PIOGLITAZONE HCL 15 MG PO TABS: 15.0000 mg | ORAL_TABLET | Freq: Every day | ORAL | 0 refills | Status: DC

## 2021-01-10 MED ORDER — PRAVASTATIN SODIUM 20 MG PO TABS: 20.0000 mg | ORAL_TABLET | Freq: Every day | ORAL | 0 refills | Status: DC

## 2021-01-12 ENCOUNTER — Other Ambulatory Visit: Payer: Self-pay

## 2021-01-12 ENCOUNTER — Encounter: Payer: Self-pay | Admitting: Occupational Therapy

## 2021-01-12 ENCOUNTER — Ambulatory Visit: Payer: PPO | Attending: Plastic Surgery | Admitting: Occupational Therapy

## 2021-01-12 DIAGNOSIS — M25632 Stiffness of left wrist, not elsewhere classified: Secondary | ICD-10-CM | POA: Diagnosis not present

## 2021-01-12 DIAGNOSIS — M6281 Muscle weakness (generalized): Secondary | ICD-10-CM | POA: Insufficient documentation

## 2021-01-12 DIAGNOSIS — R600 Localized edema: Secondary | ICD-10-CM | POA: Diagnosis not present

## 2021-01-12 DIAGNOSIS — M25532 Pain in left wrist: Secondary | ICD-10-CM | POA: Diagnosis not present

## 2021-01-13 NOTE — Therapy (Signed)
[place], [place], [address] Phone: [phone]   Fax:  [phone]  Occupational Therapy Evaluation  Patient Details  Name: Devon Payne MRN: [id number] Date of Birth: [birth year] Referring Provider (OT): Mingo Amber, MD   Encounter Date: 01/12/2021   OT End of Session - 01/12/21 1447     Visit Number 1    Number of Visits 17    Date for OT Re-Evaluation 04/12/21    Authorization Type [employer] ($30 copay)    Authorization Time Period VL: [place]    OT Start Time 1015    OT Stop Time 1145    OT Time Calculation (min) 90 min    Activity Tolerance Patient tolerated treatment well    Behavior During Therapy WFL for tasks assessed/performed            Past Medical History:  Diagnosis Date   Chronic kidney disease    kidney stones   Diabetes mellitus without complication ([place])    History of chicken pox    Hypertension     Past Surgical History:  Procedure Laterality Date   KIDNEY STONE SURGERY  1990   POSTERIOR CERVICAL FUSION/FORAMINOTOMY  04/26/2011   Procedure: POSTERIOR CERVICAL FUSION/FORAMINOTOMY LEVEL 3;  Surgeon: Elaina Hoops, MD;  Location: [facility];  Service: Neurosurgery;  Laterality: Bilateral;  Right Cervical five-six,, Left Cervical six-seven, Right Cervical seven thoracic one Posterior Cervical Foraminotomy/ Diskectomy 3hrs Rm 33 (to follow)    There were no vitals filed for this visit.   Subjective Assessment - 01/12/21 1018     Subjective  Pt arrives to session today s/p left distal radius fx that pt reports he sustained after a fall forward onto the sidewalk on 12/23/20. Pt states he was splinted in the hospital and that this has not been removed since it's placement. Per pt report, pain does frequently wake him up at night and that is usually when he takes OTC medication for mgmt. Aside from this, reports minimal impact of pain during daily activities and that he  has been able to slowly and safely return to low stress leisure activities (working for [employer], cooking)    Pertinent History Mildly comminuted minimally displaced intra-articular Lt distal radius fracture and small triquetral chip fracture s/p fall on 12/23/20; PMH includes DM, HTN, and hx of cervical spinal fusion (2013)    Limitations Immobilization of Lt wrist for 6 weeks    Patient Stated Goals Return functional use of LUE (dominant side)    Currently in Pain? No/denies             [facility] OT Assessment - 01/12/21 1020       Assessment   Medical Diagnosis Left distal radius fracture   Referring Provider (OT) Mingo Amber, MD    Onset Date/Surgical Date 12/23/20    Hand Dominance Left    Next MD Visit 01/24/21   Prior Therapy PT for neck      Precautions   Precautions Fall    Required Braces or Orthoses Other Brace/Splint   Other Brace/Splint Volar wrist immobilization orthosis     Balance Screen   Has the patient fallen in the past 6 months Yes    How many times? 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[place], travel   ADL    ADL comments Able to complete most BADLs w/ Mod I at this time   Observation/Other Assessments    Quick DASH TBA   Sensation    Additional Comments Sensation appears intact, per pt report   Edema    Edema Significant edema in Lt wrist, hand, and fingers; to be further assessed   AROM    Overall AROM Unable to assess;Due to precautions   Strength    Overall Strength Unable to assess;Due to precautions            OT Treatments/Exercises (OP) - 01/12/21 1531       Splinting   Splinting Fabricated and fitted thumb-hole  volar wrist immobilization orthosis using 1/8" perforated material. Pt positioned w/ wrist in neutral and thumb midway between palmar and radial abduction w/ thenar eminence free; pt able to oppose thumb to index finger. Thumb-hole orthosis selected to provide additional support/prevention of wrist movement. Pt instructed to wear orthosis at all times, per MD direction, and educated on further wear and care of orthosis, including caution to closely monitor skin for pressure areas; pt verbalized understanding and handout was provided to pt at conclusion of session.            OT Education - 01/12/21 1146     Education Details Education provided on purpose of orthosis, wear and care, as well as potential signs and symptoms of irritation or inadequate fit to be aware of; handout reviewed thoroughly and administered to pt.    Person(s) Educated Patient    Methods Explanation;Demonstration;Handout    Comprehension Verbalized understanding;Returned demonstration             OT Short Term Goals - 01/13/21 1530       OT SHORT TERM GOAL #1   Title Pt will verbalize understanding of wear and care for custom-facbricated orthosis as well as current precautions/restrictions to protect structures and facilitate functional recovery    Baseline Referred for fit/fabrication of custom orthosis    Time 2    Period Weeks    Status Achieved    Target Date 01/26/21      OT SHORT TERM GOAL #2   Title Pt will be independent w/ initial HEP designed for ROM    Baseline No HEP at this time    Time 4    Period Weeks    Status New    Target Date 02/28/21   Delayed target date to allow for immobilization period     OT SHORT TERM GOAL #3   Title Pt will improve AROM of wrist flexion/extension by at least 15 degrees    Baseline No AROM for 6 weeks per MD instruction    Time 4    Period Weeks    Status New    Target Date 02/28/21   Delayed target date to allow for immobilization period             OT Long Term Goals - 01/12/21 1530       OT LONG TERM GOAL #1   Title Pt will demonstrate independence w/ full HEP designed for ROM and strength    Baseline No HEP at this time    Time 8    Period  Weeks    Status New    Target Date 03/28/21   Delayed target date to allow for immobilization period     OT LONG TERM GOAL #2   Title Pt will improve Lt wrist AROM to [facility] as compared to Rt wrist by d/c    Baseline No AROM at this time per MD instruction    Time 8    Period Weeks    Status New    Target Date 03/28/21   Delayed target date to allow for immobilization period     OT LONG TERM GOAL #3   Title Pt will improve Lt hand grip strength to within at least 15# of Rt hand by d/c    Baseline No lifting/strengthening at this time per MD instruction    Time 8    Period Weeks    Status New    Target Date 03/28/21   Delayed target date to allow for immobilization period     OT LONG TERM GOAL #4   Title To demonstrate progress in functional use of LUE, pt will improve QuickDASH score by TBD at time of d/c    Baseline TBA    Time 8    Period Weeks    Status New    Target Date 03/28/21   Delayed target date to allow for immobilization period            Plan - 01/12/21 1449     Clinical Impression Statement Pt is a 70 y.o. male who presents to OP OT 3 weeks s/p Lt distal radius fracture. Pt was referred by Dr. Claudia Desanctis for fabrication of custom volar wrist orthosis for immobilization to prevent movement, protect structures, and facilitate healing for 6 weeks. Prior to appt, OT confirmed standard volar wrist orthosis vs. bi-valve orthosis w/ MD. Mr. Mahrt is being treated non-operatively and was placed in a volar short-arm splint w/ ortho-glass in the hospital which was removed w/out adverse reaction in session today. Pt will benefit from skilled occupational therapy services once cleared by MD after immobilization period to address strength, ROM, pain and edema management, and  implementation of an HEP to improve functional use of Lt, dominant UE during ADLs.   OT Occupational Profile and History Problem Focused Assessment - Including review of records relating to presenting problem    Occupational performance deficits (Please refer to evaluation for details): ADL's;IADL's;Rest and Sleep;Leisure    Body Structure / Function / Physical Skills ADL;Decreased knowledge of precautions;ROM;UE functional use;Sensation;Dexterity;Edema;Strength;Pain;IADL    Rehab Potential Good    Clinical Decision Making Several treatment options, min-mod task modification necessary    Comorbidities Affecting Occupational Performance: May have comorbidities impacting occupational performance    Modification or Assistance to Complete Evaluation  Min-Moderate modification of tasks or assist with assess necessary to complete eval    OT Frequency 2x / week    OT Duration 8 weeks   After immobilization period   OT Treatment/Interventions Self-care/ADL training;Electrical Stimulation;Therapeutic exercise;Iontophoresis;Patient/family education;Splinting;Compression bandaging;Moist Heat;Paraffin;Fluidtherapy;Therapeutic activities;Passive range of motion;Manual Therapy;DME and/or AE instruction;Ultrasound;Cryotherapy    Plan Pt to return for orthosis fit check on Tuesday 10/18; scheduled to return to OT for tx on 01/31/21 following immobilization period pending MD approval    Consulted and Agree with Plan of Care Patient            Patient will benefit from skilled therapeutic intervention in order to improve the following deficits and impairments:   Body Structure / Function / Physical Skills: ADL, Decreased knowledge  of precautions, ROM, UE functional use, Sensation, Dexterity, Edema, Strength, Pain, IADL   Visit Diagnosis: Pain in left wrist  Localized edema  Stiffness of left wrist, not elsewhere classified  Muscle weakness (generalized)   Problem List Patient Active Problem List    Diagnosis Date Noted   COVID-19 virus infection 09/08/2020   Lumbar spine tumor 03/09/2019   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 [place], [place], [address] Phone: [phone]   Fax:  [phone]  Name: AUSTIN HERD MRN: [id number] Date of Birth: [birth year]

## 2021-01-18 ENCOUNTER — Encounter: Payer: Self-pay | Admitting: Plastic Surgery

## 2021-01-18 DIAGNOSIS — S52502A Unspecified fracture of the lower end of left radius, initial encounter for closed fracture: Secondary | ICD-10-CM

## 2021-01-23 ENCOUNTER — Ambulatory Visit (HOSPITAL_BASED_OUTPATIENT_CLINIC_OR_DEPARTMENT_OTHER)
Admission: RE | Admit: 2021-01-23 | Discharge: 2021-01-23 | Disposition: A | Discharge status: Home / Self Care | Payer: PPO | Source: Ambulatory Visit | Attending: Surgical | Admitting: Surgical

## 2021-01-23 ENCOUNTER — Other Ambulatory Visit: Payer: Self-pay

## 2021-01-23 DIAGNOSIS — S52502A Unspecified fracture of the lower end of left radius, initial encounter for closed fracture: Secondary | ICD-10-CM | POA: Diagnosis not present

## 2021-01-24 ENCOUNTER — Ambulatory Visit: Payer: PPO | Admitting: Plastic Surgery

## 2021-01-24 ENCOUNTER — Encounter: Payer: Self-pay | Admitting: Occupational Therapy

## 2021-01-24 DIAGNOSIS — S52502A Unspecified fracture of the lower end of left radius, initial encounter for closed fracture: Secondary | ICD-10-CM

## 2021-01-24 NOTE — Progress Notes (Signed)
   Referring Provider Janith Lima, MD [place],  [place] [address]   CC:  Chief Complaint  Patient presents with   Follow-up      Devon Payne is an 70 y.o. male.  HPI: Patient presents for follow-up regarding left distal radius fracture.  He overall feels like things are going well.  We have chosen to treat this nonoperatively.  He is about a month out from his injury.  He has been to therapy and gotten a splint.  This seems to be fitting fine.  Review of Systems General: Denies fevers or chills  Physical Exam Vitals with BMI 12/23/2020 07/06/2020 09/28/2019  Height 5\' 7"  5\' 7"  5\' 7"   Weight 227 lbs 229 lbs 226 lbs  BMI 35.55 56.21 30.86  Systolic 578 469 629  Diastolic 99 76 84  Pulse 90 86 90    General:  No acute distress,  Alert and oriented, Non-Toxic, Normal speech and affect Left hand: Fingers well-perfused normal cap refill palp radial pulse.  Sensation is intact throughout.  He has reasonable flexion and extension of his fingers.  There is still some mild bruising dorsally.  X-ray of the left wrist taken yesterday was reviewed.  There is very subtle shift in the bony fragments and it looks like this has collapsed just a little bit.  Overall alignment I think is still reasonable to continue nonoperative treatment.  Some of the apparent shift may be due to subtle variations in the angle of the radiograph.  Assessment/Plan Patient presents about 1 month postop from closed distal radius fracture.  This seems to be coming along fine and I think we will be successful with nonoperative treatment.  We will continue the splint for another 3 weeks.  At that point we will get another x-ray and will likely discontinue the splint at that time.  I advised him to avoid range of motion of the wrist to prevent any further collapse.  He can do range of motion of the fingers.  All of his questions were answered.  Cindra Presume 01/24/2021, 10:00 AM

## 2021-01-25 ENCOUNTER — Telehealth: Payer: Self-pay | Admitting: Internal Medicine

## 2021-01-25 NOTE — Telephone Encounter (Signed)
LVM for pt to rtn my call to schedule AWV with [facility]. Please schedule this appt if pt calls the office.  °

## 2021-01-31 ENCOUNTER — Ambulatory Visit: Payer: PPO | Admitting: Occupational Therapy

## 2021-02-02 ENCOUNTER — Telehealth: Payer: Self-pay | Admitting: Internal Medicine

## 2021-02-02 NOTE — Telephone Encounter (Signed)
LVM for pt to rtn my call to schedule AWV with [facility]. Please schedule if pt calls the office.  ?

## 2021-02-08 ENCOUNTER — Ambulatory Visit (HOSPITAL_BASED_OUTPATIENT_CLINIC_OR_DEPARTMENT_OTHER)
Admission: RE | Admit: 2021-02-08 | Discharge: 2021-02-08 | Disposition: A | Discharge status: Home / Self Care | Payer: PPO | Source: Ambulatory Visit | Attending: Plastic Surgery | Admitting: Plastic Surgery

## 2021-02-08 ENCOUNTER — Other Ambulatory Visit: Payer: Self-pay

## 2021-02-08 DIAGNOSIS — M7989 Other specified soft tissue disorders: Secondary | ICD-10-CM | POA: Diagnosis not present

## 2021-02-08 DIAGNOSIS — S52502A Unspecified fracture of the lower end of left radius, initial encounter for closed fracture: Secondary | ICD-10-CM | POA: Diagnosis not present

## 2021-02-15 ENCOUNTER — Ambulatory Visit: Payer: PPO | Admitting: Plastic Surgery

## 2021-02-15 ENCOUNTER — Other Ambulatory Visit: Payer: Self-pay

## 2021-02-15 DIAGNOSIS — S52502A Unspecified fracture of the lower end of left radius, initial encounter for closed fracture: Secondary | ICD-10-CM

## 2021-02-15 NOTE — Progress Notes (Signed)
   Referring Provider Janith Lima, MD [place],   [address]   CC:  Chief Complaint  Patient presents with   Follow-up      EDON HOADLEY is an 70 y.o. male.  HPI: Patient presents for follow-up of his left distal radius fracture which was treated nonoperatively.  He has been wearing a splint.  He has no pain.  Recent x-ray looks to be fairly stable in alignment.  Review of Systems General: Denies fevers and chills  Physical Exam Vitals with BMI 12/23/2020 07/06/2020 09/28/2019  Height 5\' 7"  5\' 7"  5\' 7"   Weight 227 lbs 229 lbs 226 lbs  BMI 35.55 30.07 62.26  Systolic 333 545 625  Diastolic 99 76 84  Pulse 90 86 90    General:  No acute distress,  Alert and oriented, Non-Toxic, Normal speech and affect Left hand: Fingers well-perfused normal cap refill to palp radial pulse.  Sensation is intact throughout.  He has some limitations in motions of his fingers particularly at the index finger MP joint.  His wrist is expectedly stiff.  Soft tissue is healed nicely.  Assessment/Plan Patient presents about 2 months out from injuring his left distal radius.  We have treated this nonoperatively.  I think he can come out of his immobilization.  He may want to wear a soft wrist support to transition out of his splint.  He is going to get with physical therapy and continue with range of motion and strengthening exercises.  I will plan to see him again in around 6 weeks.  All of his questions were answered.  Cindra Presume 02/15/2021, 4:05 PM

## 2021-02-26 ENCOUNTER — Encounter: Payer: Self-pay | Admitting: Occupational Therapy

## 2021-02-26 ENCOUNTER — Ambulatory Visit: Payer: PPO | Attending: Plastic Surgery | Admitting: Occupational Therapy

## 2021-02-26 ENCOUNTER — Other Ambulatory Visit: Payer: Self-pay

## 2021-02-26 DIAGNOSIS — M25632 Stiffness of left wrist, not elsewhere classified: Secondary | ICD-10-CM | POA: Diagnosis not present

## 2021-02-26 DIAGNOSIS — R600 Localized edema: Secondary | ICD-10-CM | POA: Diagnosis not present

## 2021-02-26 DIAGNOSIS — M25532 Pain in left wrist: Secondary | ICD-10-CM | POA: Diagnosis not present

## 2021-02-26 DIAGNOSIS — M6281 Muscle weakness (generalized): Secondary | ICD-10-CM | POA: Diagnosis not present

## 2021-02-26 NOTE — Therapy (Signed)
[facility]. [place], [place], [address] Phone: [phone]   Fax:  [phone]  Occupational Therapy Treatment  Patient Details  Name: Devon Payne MRN: [id number] Date of Birth: 1950/07/19 Referring Provider (OT): Mingo Amber, MD   Encounter Date: 02/26/2021   OT End of Session - 02/26/21 1405     Visit Number 2    Number of Visits 17    Date for OT Re-Evaluation 04/12/21    Authorization Type [employer] ($30 copay)    Authorization Time Period VL: [place]    OT Start Time 1403    OT Stop Time 1450    OT Time Calculation (min) 47 min    Activity Tolerance Patient tolerated treatment well    Behavior During Therapy WFL for tasks assessed/performed            Past Medical History:  Diagnosis Date   Chronic kidney disease    kidney stones   Diabetes mellitus without complication ([place])    History of chicken pox    Hypertension     Past Surgical History:  Procedure Laterality Date   KIDNEY STONE SURGERY  1990   POSTERIOR CERVICAL FUSION/FORAMINOTOMY  04/26/2011   Procedure: POSTERIOR CERVICAL FUSION/FORAMINOTOMY LEVEL 3;  Surgeon: Elaina Hoops, MD;  Location: [facility];  Service: Neurosurgery;  Laterality: Bilateral;  Right Cervical five-six,, Left Cervical six-seven, Right Cervical seven thoracic one Posterior Cervical Foraminotomy/ Diskectomy 3hrs Rm 33 (to follow)    There were no vitals filed for this visit.   Subjective Assessment - 02/26/21 1405     Subjective  Pt reports things have been going well, there is still some swelling and stiffness around his L wrist/hand, but on 02/15/21 his ortho MD told him he could d/c wearing the custom-fabricated immobilization orthosis    Pertinent History Mildly comminuted minimally displaced intra-articular Lt distal radius fracture and small triquetral chip fracture s/p fall on 12/23/20; PMH includes DM, HTN, and hx of cervical spinal fusion (2013)     Limitations Immobilization of Lt wrist for 6 weeks    Patient Stated Goals Return functional use of LUE (dominant side)    Currently in Pain? Yes    Pain Score 3     Pain Location Wrist    Pain Orientation Left    Pain Descriptors / Indicators Sore    Pain Onset More than a month ago    Pain Frequency Intermittent    Aggravating Factors  Forearm supination    Pain Relieving Factors Ice             [facility] OT Assessment - 02/26/21 1414       AROM   Overall AROM  Deficits    AROM Assessment Site Forearm;Wrist    Right Forearm Pronation 71 Degrees    Right Forearm Supination 66 Degrees    Left Forearm Pronation 78 Degrees    Left Forearm Supination 35 Degrees    Right Wrist Extension 63 Degrees    Right Wrist Flexion 62 Degrees    Right Wrist Radial Deviation 18 Degrees    Right Wrist Ulnar Deviation 35 Degrees    Left Wrist Extension 35 Degrees    Left Wrist Flexion 36 Degrees    Left Wrist Radial Deviation 14 Degrees    Left Wrist Ulnar Deviation 18 Degrees      Right Hand AROM   R Thumb MCP 0-60 40 Degrees    R Thumb IP 0-80  65 Degrees      Left Hand AROM   L Thumb MCP 0-60 29 Degrees    L Thumb IP 0-80 34 Degrees             Treatment/Exercises - 02/26/21    Forearm Exercises AROM of L forearm sup/pro, flexing elbow when supinating and slightly extending elbow when pronating; completed 15x each w/out additional pain    Wrist Exercises AROM of L wrist flex/ext completed 10x each. To decrease discomfort during extension, OT modified positioning of fingers to facilitate tenodesis movement; additional verbal cues provided to move within tolerable level of discomfort and avoid moving past point of pain  AROM of L wrist radial/ulnar deviation w/ forearm pronated; completed 15x each w/out additional pain    Hand Exercises AROM of composite L thumb flexion completed 10x slowly w/ forearm in neutral; OT provided min verbal cues and modeling for isolated thumb  movement  Hand tendon gliding (straight, hook fist, full fist, MPJ flexion, long fist) completed 5x slowly to facilitate improved motion and strength. Pt demonstrated difficulty/tremor w/ hook, full, and long fist positions; OT encouraged pt to avoid forcing movement and complete exercise gently            OT Education - 02/26/21 1408     Education Details Education provided on edema management strategies w/ corresponding handout provided; initiated HEP (see '[place]' below)    Person(s) Educated Patient    Methods Explanation;Demonstration;Handout    Comprehension Verbalized understanding;Returned demonstration             OT Short Term Goals - 02/26/21 1815       OT SHORT TERM GOAL #1   Title Pt will verbalize understanding of wear and care for custom-facbricated orthosis as well as current precautions/restrictions to protect structures and facilitate functional recovery    Baseline Referred for fit/fabrication of custom orthosis    Time 2    Period Weeks    Status Achieved    Target Date 01/26/21      OT SHORT TERM GOAL #2   Title Pt will be independent w/ initial HEP designed for ROM    Baseline No HEP at this time    Time 4    Period Weeks    Status On-going    Target Date 03/26/21   Delayed target date to allow for immobilization period     OT SHORT TERM GOAL #3   Title Pt will improve AROM of wrist flexion/extension by at least 15 degrees    Baseline No AROM for 6 weeks per MD instruction    Time 4    Period Weeks    Status On-going    Target Date 03/26/21   Delayed target date to allow for immobilization period            OT Long Term Goals - 01/12/21 1530       OT LONG TERM GOAL #1   Title Pt will demonstrate independence w/ full HEP designed for ROM and strength    Baseline No HEP at this time    Time 8    Period Weeks    Status New    Target Date 03/28/21   Delayed target date to allow for immobilization period     OT LONG  TERM GOAL #2   Title Pt will improve Lt wrist AROM to [facility] as compared to Rt wrist by d/c    Baseline No AROM at this time per MD  instruction    Time 8    Period Weeks    Status New    Target Date 03/28/21   Delayed target date to allow for immobilization period     OT LONG TERM GOAL #3   Title Pt will improve Lt hand grip strength to within at least 15# of Rt hand by d/c    Baseline No lifting/strengthening at this time per MD instruction    Time 8    Period Weeks    Status New    Target Date 03/28/21   Delayed target date to allow for immobilization period     OT LONG TERM GOAL #4   Title To demonstrate progress in functional use of LUE, pt will improve QuickDASH score by TBD at time of d/c    Baseline TBA    Time 8    Period Weeks    Status New    Target Date 03/28/21   Delayed target date to allow for immobilization period            Plan - 02/26/21 1409     Clinical Impression Statement Pt returns to skilled OT after immobilization period and is currently 9 weeks s/p L distal radius fracture (1.5 weeks post-immobilization). AROM measurements taken this session w/ pt demonstrating deficits of forearm supination, wrist flex/ext/ulnar deviation, and thumb flexion. Per ortho MD note, pt does not have restrictions on movement so OT inititated gentle AROM exercises for forearm (emphasizing supination), wrist, and hand, per protocol. OT also provided education on benefit of tendon gliding exercises, provided demonstration, and included exercise in HEP to encourage return of joint motion, strength, and dexterity. Pt was able to complete all exercises within tolerable level of discomfort w/ OT discussing need to d/c exercises w/ new or increased pain. Edema management also discussed w/ corresponding education provided due to persistent swelling likely limiting movement and function. Due to financial considerations, pt will return to OT every other week, progressing HEP as able and  appropriate; OT is agreeable to this as pt seems motivated to improve and will likely be compliant w/ HEP.    OT Occupational Profile and History Problem Focused Assessment - Including review of records relating to presenting problem    Occupational performance deficits (Please refer to evaluation for details): ADL's;IADL's;Rest and Sleep;Leisure    Body Structure / Function / Physical Skills ADL;Decreased knowledge of precautions;ROM;UE functional use;Sensation;Dexterity;Edema;Strength;Pain;IADL    Rehab Potential Good    Clinical Decision Making Several treatment options, min-mod task modification necessary    Comorbidities Affecting Occupational Performance: May have comorbidities impacting occupational performance    Modification or Assistance to Complete Evaluation  Min-Moderate modification of tasks or assist with assess necessary to complete eval    OT Frequency 2x / week    OT Duration 8 weeks   After immobilization period   OT Treatment/Interventions Self-care/ADL training;Electrical Stimulation;Therapeutic exercise;Iontophoresis;Patient/family education;Splinting;Compression bandaging;Moist Heat;Paraffin;Fluidtherapy;Therapeutic activities;Passive range of motion;Manual Therapy;DME and/or AE instruction;Ultrasound;Cryotherapy    Plan Re-assess AROM; progress to active-assisted stretching/PROM as able; likely initiate light strengthening w/ putty -- provide graded HEP to pt is able to progress at home    OT Home Exercise Plan Access Code: [email or web address] -- forearm sup/pro AROM; wrist rad/ulnar dev AROM; tenodesis; tendon gliding    Consulted and Agree with Plan of Care Patient            Patient will benefit from skilled therapeutic intervention in order to improve the following deficits and impairments:   Body Structure /  Function / Physical Skills: ADL, Decreased knowledge of precautions, ROM, UE functional use, Sensation, Dexterity, Edema, Strength, Pain, IADL   Visit Diagnosis: Pain  in left wrist  Localized edema  Stiffness of left wrist, not elsewhere classified  Muscle weakness (generalized)   Problem List Patient Active Problem List   Diagnosis Date Noted   COVID-19 virus infection 09/08/2020   Lumbar spine tumor 03/09/2019   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 [place], [place], [address] Phone: [phone]   Fax:  [phone]  Name: AXXEL GUDE MRN: [id number] Date of Birth: Jul 17, 1950

## 2021-03-12 ENCOUNTER — Ambulatory Visit: Payer: PPO | Admitting: Occupational Therapy

## 2021-03-15 ENCOUNTER — Ambulatory Visit: Payer: PPO | Admitting: Plastic Surgery

## 2021-04-04 ENCOUNTER — Ambulatory Visit: Payer: PPO | Admitting: Plastic Surgery

## 2021-04-12 DIAGNOSIS — Z20822 Contact with and (suspected) exposure to covid-19: Secondary | ICD-10-CM | POA: Diagnosis not present

## 2021-04-12 DIAGNOSIS — J0140 Acute pansinusitis, unspecified: Secondary | ICD-10-CM | POA: Diagnosis not present

## 2021-04-12 DIAGNOSIS — R051 Acute cough: Secondary | ICD-10-CM | POA: Diagnosis not present

## 2021-04-12 DIAGNOSIS — U071 COVID-19: Secondary | ICD-10-CM | POA: Diagnosis not present

## 2021-04-16 ENCOUNTER — Other Ambulatory Visit: Payer: Self-pay | Admitting: Internal Medicine

## 2021-04-16 DIAGNOSIS — E118 Type 2 diabetes mellitus with unspecified complications: Secondary | ICD-10-CM

## 2021-04-16 DIAGNOSIS — K760 Fatty (change of) liver, not elsewhere classified: Secondary | ICD-10-CM

## 2021-04-18 ENCOUNTER — Other Ambulatory Visit: Payer: Self-pay | Admitting: Internal Medicine

## 2021-04-18 ENCOUNTER — Encounter: Payer: Self-pay | Admitting: Internal Medicine

## 2021-04-18 DIAGNOSIS — E118 Type 2 diabetes mellitus with unspecified complications: Secondary | ICD-10-CM

## 2021-04-18 DIAGNOSIS — K760 Fatty (change of) liver, not elsewhere classified: Secondary | ICD-10-CM

## 2021-04-23 ENCOUNTER — Encounter: Payer: Self-pay | Admitting: Internal Medicine

## 2021-04-23 ENCOUNTER — Ambulatory Visit (INDEPENDENT_AMBULATORY_CARE_PROVIDER_SITE_OTHER): Payer: PPO | Admitting: Internal Medicine

## 2021-04-23 ENCOUNTER — Other Ambulatory Visit: Payer: Self-pay

## 2021-04-23 VITALS — BP 138/88 | HR 91 | Temp 98.2°F | Ht 67.0 in | Wt 221.0 lb

## 2021-04-23 DIAGNOSIS — N400 Enlarged prostate without lower urinary tract symptoms: Secondary | ICD-10-CM | POA: Diagnosis not present

## 2021-04-23 DIAGNOSIS — I10 Essential (primary) hypertension: Secondary | ICD-10-CM | POA: Diagnosis not present

## 2021-04-23 DIAGNOSIS — K7581 Nonalcoholic steatohepatitis (NASH): Secondary | ICD-10-CM

## 2021-04-23 DIAGNOSIS — Z0000 Encounter for general adult medical examination without abnormal findings: Secondary | ICD-10-CM | POA: Diagnosis not present

## 2021-04-23 DIAGNOSIS — E118 Type 2 diabetes mellitus with unspecified complications: Secondary | ICD-10-CM | POA: Diagnosis not present

## 2021-04-23 DIAGNOSIS — E785 Hyperlipidemia, unspecified: Secondary | ICD-10-CM

## 2021-04-23 DIAGNOSIS — K760 Fatty (change of) liver, not elsewhere classified: Secondary | ICD-10-CM

## 2021-04-23 LAB — URINALYSIS, ROUTINE W REFLEX MICROSCOPIC
Bilirubin Urine: NEGATIVE
Hgb urine dipstick: NEGATIVE
Ketones, ur: NEGATIVE
Leukocytes,Ua: NEGATIVE
Nitrite: NEGATIVE
RBC / HPF: NONE SEEN (ref 0–?)
Specific Gravity, Urine: 1.015 (ref 1.000–1.030)
Total Protein, Urine: NEGATIVE
Urine Glucose: NEGATIVE
Urobilinogen, UA: 1 (ref 0.0–1.0)
pH: 6.5 (ref 5.0–8.0)

## 2021-04-23 LAB — CBC WITH DIFFERENTIAL/PLATELET
Basophils Absolute: 0 10*3/uL (ref 0.0–0.1)
Basophils Relative: 0.6 % (ref 0.0–3.0)
Eosinophils Absolute: 0.1 10*3/uL (ref 0.0–0.7)
Eosinophils Relative: 1.4 % (ref 0.0–5.0)
HCT: 49.1 % (ref 39.0–52.0)
Hemoglobin: 16.3 g/dL (ref 13.0–17.0)
Lymphocytes Relative: 29.6 % (ref 12.0–46.0)
Lymphs Abs: 2.1 10*3/uL (ref 0.7–4.0)
MCHC: 33.2 g/dL (ref 30.0–36.0)
MCV: 85.2 fl (ref 78.0–100.0)
Monocytes Absolute: 0.8 10*3/uL (ref 0.1–1.0)
Monocytes Relative: 11 % (ref 3.0–12.0)
Neutro Abs: 4.1 10*3/uL (ref 1.4–7.7)
Neutrophils Relative %: 57.4 % (ref 43.0–77.0)
Platelets: 278 10*3/uL (ref 150.0–400.0)
RBC: 5.77 Mil/uL (ref 4.22–5.81)
RDW: 14.5 % (ref 11.5–15.5)
WBC: 7.1 10*3/uL (ref 4.0–10.5)

## 2021-04-23 LAB — LIPID PANEL
Cholesterol: 199 mg/dL (ref 0–200)
HDL: 52.7 mg/dL (ref >39.00)
LDL Cholesterol: 110 mg/dL — ABNORMAL HIGH (ref 0–99)
NonHDL: 146.45
Total CHOL/HDL Ratio: 4
Triglycerides: 184 mg/dL — ABNORMAL HIGH (ref 0.0–149.0)
VLDL: 36.8 mg/dL (ref 0.0–40.0)

## 2021-04-23 LAB — HEPATIC FUNCTION PANEL
ALT: 34 U/L (ref 0–53)
AST: 30 U/L (ref 0–37)
Albumin: 4.8 g/dL (ref 3.5–5.2)
Alkaline Phosphatase: 38 U/L — ABNORMAL LOW (ref 39–117)
Bilirubin, Direct: 0.2 mg/dL (ref 0.0–0.3)
Total Bilirubin: 1 mg/dL (ref 0.2–1.2)
Total Protein: 7.7 g/dL (ref 6.0–8.3)

## 2021-04-23 LAB — BASIC METABOLIC PANEL
BUN: 17 mg/dL (ref 6–23)
CO2: 29 mEq/L (ref 19–32)
Calcium: 9.9 mg/dL (ref 8.4–10.5)
Chloride: 98 mEq/L (ref 96–112)
Creatinine, Ser: 0.99 mg/dL (ref 0.40–1.50)
GFR: 76.91 mL/min (ref >60.00)
Glucose, Bld: 93 mg/dL (ref 70–99)
Potassium: 4.3 mEq/L (ref 3.5–5.1)
Sodium: 140 mEq/L (ref 135–145)

## 2021-04-23 LAB — PSA: PSA: 2.1 ng/mL (ref 0.10–4.00)

## 2021-04-23 LAB — HEMOGLOBIN A1C: Hgb A1c MFr Bld: 6.7 % — ABNORMAL HIGH (ref 4.6–6.5)

## 2021-04-23 LAB — TSH: TSH: 1.67 u[IU]/mL (ref 0.35–5.50)

## 2021-04-23 LAB — MICROALBUMIN / CREATININE URINE RATIO
Creatinine,U: 111.2 mg/dL
Microalb Creat Ratio: 1.5 mg/g (ref 0.0–30.0)
Microalb, Ur: 1.7 mg/dL (ref 0.0–1.9)

## 2021-04-23 LAB — PROTIME-INR
INR: 1 ratio (ref 0.8–1.0)
Prothrombin Time: 11.1 s (ref 9.6–13.1)

## 2021-04-23 MED ORDER — PRAVASTATIN SODIUM 20 MG PO TABS: 20.0000 mg | ORAL_TABLET | Freq: Every day | ORAL | 1 refills | Status: DC

## 2021-04-23 MED ORDER — INDAPAMIDE 1.25 MG PO TABS: 1.2500 mg | ORAL_TABLET | Freq: Every day | ORAL | 1 refills | Status: DC

## 2021-04-23 NOTE — Progress Notes (Signed)
Subjective:  Patient ID: Devon Payne, male    DOB: [birth year]  Age: 71 y.o. MRN: [id number]  CC: Annual Exam, Hypertension, Hyperlipidemia, and Diabetes  This visit occurred during the SARS-CoV-2 public health emergency.  Safety protocols were in place, including screening questions prior to the visit, additional usage of staff PPE, and extensive cleaning of exam room while observing appropriate contact time as indicated for disinfecting solutions.    HPI TORRY ISTRE presents for a CPX and f/up -   He is active and denies chest pain, shortness of breath, diaphoresis, dizziness, lightheadedness, edema, fatigue, or polys.  Outpatient Medications Prior to Visit  Medication Sig Dispense Refill   aspirin EC 81 MG tablet Take 1 tablet (81 mg total) by mouth daily. 90 tablet 3   indapamide (LOZOL) 1.25 MG tablet TAKE 1 TABLET BY MOUTH EACH DAY 90 tablet 1   pioglitazone (ACTOS) 15 MG tablet Take 1 tablet (15 mg total) by mouth daily. 90 tablet 0   pravastatin (PRAVACHOL) 20 MG tablet Take 1 tablet (20 mg total) by mouth at bedtime. 90 tablet 0   HYDROcodone-acetaminophen (NORCO/VICODIN) 5-325 MG tablet Take 1-2 tablets by mouth every 6 (six) hours as needed for moderate pain or severe pain. 20 tablet 0   No facility-administered medications prior to visit.    ROS Review of Systems  Constitutional:  Negative for chills, diaphoresis, fatigue and fever.  HENT: Negative.    Eyes: Negative.   Respiratory:  Negative for cough, chest tightness, shortness of breath and wheezing.   Cardiovascular:  Negative for chest pain, palpitations and leg swelling.  Gastrointestinal:  Negative for abdominal pain, constipation, diarrhea, nausea and vomiting.  Endocrine: Negative.   Genitourinary: Negative.  Negative for difficulty urinating.  Musculoskeletal: Negative.  Negative for arthralgias and myalgias.  Skin: Negative.   Neurological: Negative.  Negative for dizziness, weakness, light-headedness  and headaches.  Hematological:  Negative for adenopathy. Does not bruise/bleed easily.  Psychiatric/Behavioral: Negative.     Objective:  BP 138/88 (BP Location: Left Arm, Patient Position: Sitting, Cuff Size: Large)    Pulse 91    Temp 98.2 F (36.8 C) (Oral)    Ht 5\' 7"  (1.702 m)    Wt 221 lb (100.2 kg)    SpO2 97%    BMI 34.61 kg/m   BP Readings from Last 3 Encounters:  04/23/21 138/88  12/23/20 (!) 162/99  07/06/20 138/76    Wt Readings from Last 3 Encounters:  04/23/21 221 lb (100.2 kg)  12/23/20 227 lb (103 kg)  07/06/20 229 lb (103.9 kg)    Physical Exam Vitals reviewed.  Constitutional:      Appearance: He is obese. He is not ill-appearing.  HENT:     Nose: Nose normal.     Mouth/Throat:     Mouth: Mucous membranes are moist.  Eyes:     General: No scleral icterus.    Conjunctiva/sclera: Conjunctivae normal.  Cardiovascular:     Rate and Rhythm: Normal rate and regular rhythm.     Heart sounds: Normal heart sounds, S1 normal and S2 normal. No murmur heard.   No gallop.     Comments: EKG- NSR, 81 bpm Inf infarct pattern is old No LVH unchanged Pulmonary:     Effort: Pulmonary effort is normal.     Breath sounds: No stridor. No wheezing, rhonchi or rales.  Abdominal:     General: Abdomen is protuberant. Bowel sounds are normal. There is no distension.  Palpations: Abdomen is soft. There is no fluid wave, hepatomegaly, splenomegaly or mass.     Tenderness: There is no abdominal tenderness.     Hernia: There is no hernia in the left inguinal area or right inguinal area.  Genitourinary:    Pubic Area: No rash.      Penis: Normal and circumcised.      Testes: Normal.     Epididymis:     Right: Normal.     Left: Normal.     Prostate: Enlarged. Not tender and no nodules present.     Rectum: Normal. Guaiac result negative. No mass, tenderness, anal fissure, external hemorrhoid or internal hemorrhoid. Normal anal tone.  Musculoskeletal:     Right lower leg:  No edema.     Left lower leg: No edema.  Lymphadenopathy:     Lower Body: No right inguinal adenopathy. No left inguinal adenopathy.  Neurological:     Mental Status: He is alert.    Lab Results  Component Value Date   WBC 7.1 04/23/2021   HGB 16.3 04/23/2021   HCT 49.1 04/23/2021   PLT 278.0 04/23/2021   GLUCOSE 93 04/23/2021   CHOL 199 04/23/2021   TRIG 184.0 (H) 04/23/2021   HDL 52.70 04/23/2021   LDLDIRECT 110.0 09/28/2019   LDLCALC 110 (H) 04/23/2021   ALT 34 04/23/2021   AST 30 04/23/2021   NA 140 04/23/2021   K 4.3 04/23/2021   CL 98 04/23/2021   CREATININE 0.99 04/23/2021   BUN 17 04/23/2021   CO2 29 04/23/2021   TSH 1.67 04/23/2021   PSA 2.10 04/23/2021   INR 1.0 04/23/2021   HGBA1C 6.7 (H) 04/23/2021   MICROALBUR 1.7 04/23/2021    DG Wrist Complete Left  Result Date: 02/10/2021 CLINICAL DATA:  Distal radial fracture EXAM: LEFT WRIST - COMPLETE 3+ VIEW COMPARISON:  01/23/2021 FINDINGS: Frontal, oblique, lateral, and ulnar deviated views of the left wrist are obtained. There is a comminuted impacted intra-articular distal left radial fracture in stable position since prior exam, with dorsal angulation at the fracture site. Radiocarpal joint remains intact. No other acute bony abnormalities. 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 Marked soft tissue swelling greatest along the dorsum of the wrist. IMPRESSION: 1. Stable comminuted intra-articular distal left radial fracture, with continue dorsal impaction at the fracture site. 2. Stable degenerative changes within the radial aspect of the carpus. Electronically Signed   By: Randa Ngo M.D.   On: 02/10/2021 15:06    Assessment & Plan:   Merik was seen today for annual exam, hypertension, hyperlipidemia and diabetes.  Diagnoses and all orders for this visit:  Nonalcoholic steatohepatitis ([facility])- His liver enzymes are normal. -     Hepatic function panel; Future -     Protime-INR; Future -      Protime-INR -     Hepatic function panel  Essential hypertension- He has not achieved his blood pressure goal of 130/80.  Will add an ARB to indapamide. -     Basic metabolic panel; Future -     TSH; Future -     CBC with Differential/Platelet; Future -     indapamide (LOZOL) 1.25 MG tablet; Take 1 tablet (1.25 mg total) by mouth daily. -     EKG 12-Lead -     CBC with Differential/Platelet -     TSH -     Basic metabolic panel  Type II diabetes mellitus with manifestations ([place])- His A1c is at 6.5%.  Will  discontinue pioglitazone.  He does not tolerate metformin or GLP-1 agonist.  I recommended that he start taking an SGLT2 inhibitor for cardiovascular benefits. -     Microalbumin / creatinine urine ratio; Future -     Microalbumin / creatinine urine ratio  Fatty liver disease, nonalcoholic  Type 2 diabetes mellitus with complication, without long-term current use of insulin ([place])- See above. -     Hemoglobin A1c; Future -     HM Diabetes Foot Exam -     Hemoglobin A1c -     Discontinue: Semaglutide (RYBELSUS) 3 MG TABS; Take 3 mg by mouth daily. -     dapagliflozin propanediol (FARXIGA) 10 MG TABS tablet; Take 1 tablet (10 mg total) by mouth daily before breakfast.  Hyperlipidemia with target LDL less than 100- LDL goal achieved. Doing well on the statin  -     Lipid panel; Future -     TSH; Future -     pravastatin (PRAVACHOL) 20 MG tablet; Take 1 tablet (20 mg total) by mouth at bedtime. -     TSH -     Lipid panel  Benign prostatic hyperplasia without lower urinary tract symptoms- His PSA is reassuring. -     PSA; Future -     Urinalysis, Routine w reflex microscopic; Future -     Urinalysis, Routine w reflex microscopic -     PSA  Routine general medical examination at a health care facility- Exam completed, labs reviewed, vaccines reviewed and updated, cancer screenings are up-to-date, patient education was given.   I have discontinued Dominica Severin L. Babson's  HYDROcodone-acetaminophen, pioglitazone, and Rybelsus. I have also changed his indapamide. Additionally, I am having him start on dapagliflozin propanediol. Lastly, I am having him maintain his aspirin EC and pravastatin.  Meds ordered this encounter  Medications   indapamide (LOZOL) 1.25 MG tablet    Sig: Take 1 tablet (1.25 mg total) by mouth daily.    Dispense:  90 tablet    Refill:  1   pravastatin (PRAVACHOL) 20 MG tablet    Sig: Take 1 tablet (20 mg total) by mouth at bedtime.    Dispense:  90 tablet    Refill:  1   DISCONTD: Semaglutide (RYBELSUS) 3 MG TABS    Sig: Take 3 mg by mouth daily.    Dispense:  30 tablet    Refill:  0   dapagliflozin propanediol (FARXIGA) 10 MG TABS tablet    Sig: Take 1 tablet (10 mg total) by mouth daily before breakfast.    Dispense:  90 tablet    Refill:  1     Follow-up: Return in about 6 months (around 10/21/2021).  Scarlette Calico, MD

## 2021-04-23 NOTE — Patient Instructions (Signed)

## 2021-04-24 ENCOUNTER — Encounter: Payer: Self-pay | Admitting: Internal Medicine

## 2021-04-24 MED ORDER — OLMESARTAN MEDOXOMIL 20 MG PO TABS: 20.0000 mg | ORAL_TABLET | Freq: Every day | ORAL | 1 refills | Status: DC

## 2021-04-24 MED ORDER — DAPAGLIFLOZIN PROPANEDIOL 10 MG PO TABS: 10.0000 mg | ORAL_TABLET | Freq: Every day | ORAL | 1 refills | Status: DC

## 2021-04-24 MED ORDER — RYBELSUS 3 MG PO TABS: 3.0000 mg | ORAL_TABLET | Freq: Every day | ORAL | 0 refills | Status: DC

## 2021-04-26 ENCOUNTER — Other Ambulatory Visit: Payer: Self-pay

## 2021-04-26 ENCOUNTER — Ambulatory Visit: Payer: PPO | Admitting: Plastic Surgery

## 2021-04-26 DIAGNOSIS — S52502A Unspecified fracture of the lower end of left radius, initial encounter for closed fracture: Secondary | ICD-10-CM | POA: Diagnosis not present

## 2021-04-26 NOTE — Progress Notes (Signed)
° °  Referring Provider Janith Lima, MD [place],  [place] [address]   CC:  Chief Complaint  Patient presents with   Follow-up      Devon Payne is an 71 y.o. male.  HPI: Patient presents 4 months out from distal radius fracture.  This was treated nonoperatively.  He feels like things are going well and that his function is good at this point.  He has very little pain and feels like he has good range of motion.  Review of Systems General: Denies fevers or chills  Physical Exam Vitals with BMI 04/23/2021 12/23/2020 07/06/2020  Height 5\' 7"  5\' 7"  5\' 7"   Weight 221 lbs 227 lbs 229 lbs  BMI 34.61 83.81 84.03  Systolic 754 360 677  Diastolic 88 99 76  Pulse 91 90 86    General:  No acute distress,  Alert and oriented, Non-Toxic, Normal speech and affect Left hand: Fingers well-perfused normal cap refill palp radial pulse.  Sensation is intact throughout.  He can make a full fist.  Pronation and supination are intact.  He has 20 to 30 degrees of flexion and extension at least actively.  Assessment/Plan Patient's had a good outcome after nonoperative treatment of a left distal radius fracture.  He is content to follow-up as needed.  All of his questions were answered we will happy to see him again on an as-needed basis.  Cindra Presume 04/26/2021, 12:03 PM

## 2021-06-11 DIAGNOSIS — J0140 Acute pansinusitis, unspecified: Secondary | ICD-10-CM | POA: Diagnosis not present

## 2021-06-11 DIAGNOSIS — I10 Essential (primary) hypertension: Secondary | ICD-10-CM | POA: Diagnosis not present

## 2021-06-20 DIAGNOSIS — D3131 Benign neoplasm of right choroid: Secondary | ICD-10-CM | POA: Diagnosis not present

## 2021-06-20 DIAGNOSIS — H40053 Ocular hypertension, bilateral: Secondary | ICD-10-CM | POA: Diagnosis not present

## 2021-06-20 DIAGNOSIS — E1165 Type 2 diabetes mellitus with hyperglycemia: Secondary | ICD-10-CM | POA: Diagnosis not present

## 2021-06-20 LAB — HM DIABETES EYE EXAM

## 2021-07-23 ENCOUNTER — Other Ambulatory Visit: Payer: Self-pay | Admitting: Internal Medicine

## 2021-07-23 DIAGNOSIS — I10 Essential (primary) hypertension: Secondary | ICD-10-CM

## 2021-07-23 DIAGNOSIS — E118 Type 2 diabetes mellitus with unspecified complications: Secondary | ICD-10-CM

## 2021-10-10 ENCOUNTER — Telehealth: Payer: Self-pay | Admitting: Internal Medicine

## 2021-10-10 NOTE — Telephone Encounter (Signed)
Left message for patient to call back to schedule Medicare Annual Wellness Visit   Last AWV  09/28/19  Please schedule at anytime with [facility]-Nurse Health Advisor if patient calls the office back.     Any questions, please call me at [phone]  

## 2021-10-22 ENCOUNTER — Other Ambulatory Visit: Payer: Self-pay | Admitting: Internal Medicine

## 2021-10-22 DIAGNOSIS — I10 Essential (primary) hypertension: Secondary | ICD-10-CM

## 2021-11-19 ENCOUNTER — Other Ambulatory Visit: Payer: Self-pay | Admitting: Internal Medicine

## 2021-11-19 DIAGNOSIS — E785 Hyperlipidemia, unspecified: Secondary | ICD-10-CM

## 2022-01-21 ENCOUNTER — Other Ambulatory Visit: Payer: Self-pay | Admitting: Internal Medicine

## 2022-01-21 DIAGNOSIS — I10 Essential (primary) hypertension: Secondary | ICD-10-CM

## 2022-01-21 DIAGNOSIS — E118 Type 2 diabetes mellitus with unspecified complications: Secondary | ICD-10-CM

## 2022-02-04 ENCOUNTER — Other Ambulatory Visit: Payer: Self-pay | Admitting: Internal Medicine

## 2022-02-04 DIAGNOSIS — I10 Essential (primary) hypertension: Secondary | ICD-10-CM

## 2022-02-05 ENCOUNTER — Other Ambulatory Visit: Payer: Self-pay | Admitting: Internal Medicine

## 2022-02-05 DIAGNOSIS — I10 Essential (primary) hypertension: Secondary | ICD-10-CM

## 2022-02-06 ENCOUNTER — Other Ambulatory Visit: Payer: Self-pay | Admitting: Internal Medicine

## 2022-02-06 ENCOUNTER — Encounter: Payer: Self-pay | Admitting: Internal Medicine

## 2022-02-06 DIAGNOSIS — I10 Essential (primary) hypertension: Secondary | ICD-10-CM

## 2022-02-06 DIAGNOSIS — E118 Type 2 diabetes mellitus with unspecified complications: Secondary | ICD-10-CM

## 2022-02-06 MED ORDER — INDAPAMIDE 1.25 MG PO TABS: 1.2500 mg | ORAL_TABLET | Freq: Every day | ORAL | 0 refills | Status: DC

## 2022-02-06 MED ORDER — OLMESARTAN MEDOXOMIL 20 MG PO TABS: 20.0000 mg | ORAL_TABLET | Freq: Every day | ORAL | 0 refills | Status: DC

## 2022-02-19 ENCOUNTER — Other Ambulatory Visit: Payer: Self-pay | Admitting: Internal Medicine

## 2022-02-19 DIAGNOSIS — E785 Hyperlipidemia, unspecified: Secondary | ICD-10-CM

## 2022-02-26 ENCOUNTER — Encounter: Payer: Self-pay | Admitting: Internal Medicine

## 2022-02-26 ENCOUNTER — Other Ambulatory Visit: Payer: Self-pay | Admitting: Internal Medicine

## 2022-02-26 DIAGNOSIS — I10 Essential (primary) hypertension: Secondary | ICD-10-CM

## 2022-02-26 MED ORDER — INDAPAMIDE 1.25 MG PO TABS: 1.2500 mg | ORAL_TABLET | Freq: Every day | ORAL | 0 refills | Status: DC

## 2022-03-04 ENCOUNTER — Other Ambulatory Visit: Payer: Self-pay | Admitting: Internal Medicine

## 2022-03-04 DIAGNOSIS — I10 Essential (primary) hypertension: Secondary | ICD-10-CM

## 2022-03-19 ENCOUNTER — Ambulatory Visit: Payer: PPO | Admitting: Internal Medicine

## 2022-03-29 ENCOUNTER — Telehealth: Payer: PPO | Admitting: Nurse Practitioner

## 2022-03-29 DIAGNOSIS — J069 Acute upper respiratory infection, unspecified: Secondary | ICD-10-CM | POA: Diagnosis not present

## 2022-03-29 MED ORDER — BENZONATATE 100 MG PO CAPS: 100.0000 mg | ORAL_CAPSULE | Freq: Three times a day (TID) | ORAL | 0 refills | Status: DC | PRN

## 2022-03-29 MED ORDER — PREDNISONE 20 MG PO TABS: 40.0000 mg | ORAL_TABLET | Freq: Every day | ORAL | 0 refills | Status: AC

## 2022-03-29 NOTE — Progress Notes (Signed)
We are sorry that you are not feeling well.  Here is how we plan to help!  Based on your presentation I believe you most likely have A cough due to a virus.  This is called viral bronchitis and is best treated by rest, plenty of fluids and control of the cough.  You may use Ibuprofen or Tylenol as directed to help your symptoms.     In addition you may use A prescription cough medication called Tessalon Perles 170m. You may take 1-2 capsules every 8 hours as needed for your cough.  Prednisone 229mtablets- 2 tablets at the same time daily for 5 days  From your responses in the eVisit questionnaire you describe inflammation in the upper respiratory tract which is causing a significant cough.  This is commonly called Bronchitis and has four common causes:   Allergies Viral Infections Acid Reflux Bacterial Infection Allergies, viruses and acid reflux are treated by controlling symptoms or eliminating the cause. An example might be a cough caused by taking certain blood pressure medications. You stop the cough by changing the medication. Another example might be a cough caused by acid reflux. Controlling the reflux helps control the cough.  USE OF BRONCHODILATOR ("RESCUE") INHALERS: There is a risk from using your bronchodilator too frequently.  The risk is that over-reliance on a medication which only relaxes the muscles surrounding the breathing tubes can reduce the effectiveness of medications prescribed to reduce swelling and congestion of the tubes themselves.  Although you feel brief relief from the bronchodilator inhaler, your asthma may actually be worsening with the tubes becoming more swollen and filled with mucus.  This can delay other crucial treatments, such as oral steroid medications. If you need to use a bronchodilator inhaler daily, several times per day, you should discuss this with your provider.  There are probably better treatments that could be used to keep your asthma under  control.     HOME CARE Only take medications as instructed by your medical team. Complete the entire course of an antibiotic. Drink plenty of fluids and get plenty of rest. Avoid close contacts especially the very young and the elderly Cover your mouth if you cough or cough into your sleeve. Always remember to wash your hands A steam or ultrasonic humidifier can help congestion.   GET HELP RIGHT AWAY IF: You develop worsening fever. You become short of breath You cough up blood. Your symptoms persist after you have completed your treatment plan MAKE SURE YOU  Understand these instructions. Will watch your condition. Will get help right away if you are not doing well or get worse.    Thank you for choosing an e-visit.  Your e-visit answers were reviewed by a board certified advanced clinical practitioner to complete your personal care plan. Depending upon the condition, your plan could have included both over the counter or prescription medications.  Please review your pharmacy choice. Make sure the pharmacy is open so you can pick up prescription now. If there is a problem, you may contact your provider through [employer] have the prescription routed to another pharmacy.  Your safety is important to usKoreaIf you have drug allergies check your prescription carefully.   For the next 24 hours you can use MyChart to ask questions about today's visit, request a non-urgent call back, or ask for a work or school excuse. You will get an email in the next two days asking about your experience. I hope that your e-visit  has been valuable and will speed your recovery.  Devon Payne Done, FNP   5-10 minutes spent reviewing and documenting in chart.

## 2022-04-02 ENCOUNTER — Ambulatory Visit (INDEPENDENT_AMBULATORY_CARE_PROVIDER_SITE_OTHER): Payer: PPO

## 2022-04-02 VITALS — Ht 67.0 in | Wt 225.0 lb

## 2022-04-02 DIAGNOSIS — Z0000 Encounter for general adult medical examination without abnormal findings: Secondary | ICD-10-CM

## 2022-04-02 NOTE — Progress Notes (Signed)
Virtual Visit via Telephone Note  I connected with  Devon Payne on 04/02/22 at 10:45 AM EST by telephone and verified that I am speaking with the correct person using two identifiers.  Location: Patient: Home Provider: [place] Persons participating in the virtual visit: [place]   I discussed the limitations, risks, security and privacy concerns of performing an evaluation and management service by telephone and the availability of in person appointments. The patient expressed understanding and agreed to proceed.  Interactive audio and video telecommunications were attempted between this nurse and patient, however failed, due to patient having technical difficulties OR patient did not have access to video capability.  We continued and completed visit with audio only.  Some vital signs may be absent or patient reported.   Sheral Flow, LPN  Subjective:   Devon Payne is a 72 y.o. male who presents for Medicare Annual/Subsequent preventive examination.  Review of Systems     Cardiac Risk Factors include: advanced age (>11mn, >>64women);dyslipidemia;family history of premature cardiovascular disease;hypertension;male gender;obesity (BMI >30kg/m2)     Objective:    Today's Vitals   04/02/22 1046  Weight: 225 lb (102.1 kg)  Height: _0  (1.702 m)  PainSc: 0-No pain   Body mass index is 35.24 kg/m.     04/02/2022   10:49 AM 12/23/2020   11:12 AM 09/28/2019    2:08 PM 04/05/2019    9:54 AM 03/09/2019   10:04 AM 04/30/2018    9:33 AM 06/16/2016    6:44 PM  Advanced Directives  Does Patient Have a Medical Advance Directive? _1  No No  Would patient like information on creating a medical advance directive? No - Patient declined No - Patient declined Yes (MAU/Ambulatory/Procedural Areas - Information given) No - Patient declined No - Patient declined No - Patient declined No - Patient declined    Current Medications  (verified) Outpatient Encounter Medications as of 04/02/2022  Medication Sig   aspirin EC 81 MG tablet Take 1 tablet (81 mg total) by mouth daily.   benzonatate (TESSALON PERLES) 100 MG capsule Take 1 capsule (100 mg total) by mouth 3 (three) times daily as needed.   indapamide (LOZOL) 1.25 MG tablet TAKE 1 TABLET BY MOUTH EVERY DAY   olmesartan (BENICAR) 20 MG tablet Take 1 tablet (20 mg total) by mouth daily.   pravastatin (PRAVACHOL) 20 MG tablet TAKE 1 TABLET BY MOUTH EACH NIGHT AT BEDTIME   predniSONE (DELTASONE) 20 MG tablet Take 2 tablets (40 mg total) by mouth daily with breakfast for 5 days. 2 po daily for 5 days   [DISCONTINUED] dapagliflozin propanediol (FARXIGA) 10 MG TABS tablet Take 1 tablet (10 mg total) by mouth daily before breakfast.   No facility-administered encounter medications on file as of 04/02/2022.    Allergies (verified) Metformin and related, Semaglutide(0.25 or 0.517mdos), Atorvastatin, Penicillins, Clindamycin, and Amoxicillin   History: Past Medical History:  Diagnosis Date   Chronic kidney disease    kidney stones   Di[place] mellitus [place]   History of chicken pox    Hypertension    Past Surgical History:  Procedure Laterality Date   KIDNEY STONE SURGERY  1990   POSTERIOR CERVICAL FUSION/FORAMINOTOMY  04/26/2011   Procedure: POSTERIOR CERVICAL FUSION/FORAMINOTOMY LEVEL 3;  Surgeon: GaElaina HoopsMD;  Location: [facility];  Service: Neurosurgery;  Laterality: Bilateral;  Right Cervical five-six,, Left Cervical six-seven, Right Cervical seven thoracic one Posterior Cervical Foraminotomy/  Diskectomy 3hrs Rm 33 (to follow)   Family History  Problem Relation Age of Onset   Cancer Mother        breast   Heart disease Father    Diabetes Father    Diabetes Other    Alcohol abuse Neg Hx    COPD Neg Hx    Early death Neg Hx    Hearing loss Neg Hx    Hyperlipidemia Neg Hx    Hypertension Neg Hx    Kidney disease Neg Hx    Stroke  Neg Hx    Social History   Socioeconomic History   Marital status: Single    Spouse name: Not on file   Number of children: Not on file   Years of education: Not on file   Highest education level: Not on file  Occupational History   Not on file  Tobacco Use   Smoking status: Never   Smokeless tobacco: Never  Substance and Sexual Activity   Alcohol use: No   Drug use: No   Sexual activity: Yes  Other Topics Concern   Not on file  Social History Narrative   Not on file   Social Determinants of Health   Financial Resource Strain: Low Risk  (04/02/2022)   Overall Financial Resource Strain (CARDIA)    Difficulty of Paying Living Expenses: Not hard at all  Food Insecurity: No Food Insecurity (04/02/2022)   Hunger Vital Sign    Worried About Running Out of Food in the Last Year: Never true    Ran Out of Food in the Last Year: Never true  Transportation Needs: No Transportation Needs (04/02/2022)   PRAPARE - Hydrologist (Medical): No    Lack of Transportation (Non-Medical): No  Physical Activity: Sufficiently Active (04/02/2022)   Exercise Vital Sign    Days of Exercise per Week: 5 days    Minutes of Exercise per Session: 30 min  Stress: No Stress Concern Present (04/02/2022)   [place]    Feeling of Stress : Not at all  Social Connections: Unknown (04/02/2022)   Social Connection and Isolation Panel [NHANES]    Frequency of Communication with Friends and Family: More than three times a week    Frequency of Social Gatherings with Friends and Family: More than three times a week    Attends Religious Services: Not on Advertising copywriter or Organizations: Not on file    Attends Archivist Meetings: Not on file    Marital Status: Never married    Tobacco Counseling Counseling given: Not Answered   Clinical Intake:  Pre-visit preparation completed: Yes  Pain :  No/denies pain Pain Score: 0-No pain     BMI - recorded: 35.24 Nutritional Status: BMI > 30  Obese Nutritional Risks: None Diabetes: Yes CBG done?: No Did pt. bring in CBG monitor from home?: No  How often do you need to have someone help you when you read instructions, pamphlets, or other written materials from your doctor or pharmacy?: 1 - Never What is the last grade level you completed in school?: HSG  Nutrition Risk Assessment:  Has the patient had any N/V/D within the last 2 months?  No  Does the patient have any non-healing wounds?  No  Has the patient had any unintentional weight loss or weight gain?  No   Diabetes:  Is the patient diabetic?  Yes ; diet  controlled If diabetic, was a CBG obtained today?  No  Did the patient bring in their glucometer from home?  No  How often do you monitor your CBG's? None.   Financial Strains and Diabetes Management:  Are you having any financial strains with the device, your supplies or your medication? No .  Does the patient want to be seen by Chronic Care Management for management of their diabetes?  No  Would the patient like to be referred to a Nutritionist or for Diabetic Management?  No   Diabetic Exams:  Diabetic Eye Exam: Completed 06/20/2021 Diabetic Foot Exam: Completed 04/23/2021   Interpreter Needed?: No  Information entered by :: Lisette Abu, LPN.   Activities of Daily Living    04/02/2022   10:54 AM 10/14/2021   11:52 AM  In your present state of health, do you have any difficulty performing the following activities:  Hearing? 0 0  Vision? 0 0  Difficulty concentrating or making decisions? 0 0  Walking or climbing stairs? 0 0  Dressing or bathing? 0 0  Doing errands, shopping? 0 0  Preparing Food and eating ? N N  Using the Toilet? N N  In the past six months, have you accidently leaked urine? N N  Do you have problems with loss of bowel control? N N  Managing your Medications? N N  Managing your  Finances? N N  Housekeeping or managing your Housekeeping? N N    Patient Care Team: Janith Lima, MD as PCP - General (Internal Medicine) Antony Blackbird as Consulting Physician (Optometry)  Indicate any recent Medical Services you may have received from other than [facility] providers in the past year (date may be approximate).     Assessment:   This is a routine wellness examination for Devon Payne.  Hearing/Vision screen Hearing Screening - Comments:: Denies hearing difficulties   Vision Screening - Comments:: Wears rx glasses - up to date with routine eye exams with [facility] (Dr. Arsenio Loader)   Dietary issues and exercise activities discussed: Current Exercise Habits: The patient has a physically strenuous job, but has no regular exercise apart from work., Exercise limited by: None identified   Goals Addressed               This Visit's Progress     Client understands the importance of follow-up with providers by attending scheduled visits. (pt-stated)        My goal for 2024 is to lose 50 pounds.      Depression Screen    04/02/2022   10:52 AM 04/23/2021    1:15 PM 09/28/2019    2:10 PM 04/30/2018    9:34 AM 01/23/2018    3:53 PM 01/21/2018    8:25 AM 06/16/2016    6:45 PM  PHQ 2/9 Scores  PHQ - 2 Score 0 0 0 0 0 0 0    Fall Risk    04/02/2022   10:50 AM 10/14/2021   11:52 AM 04/23/2021    1:15 PM 09/28/2019    2:09 PM 04/30/2018    9:34 AM  Fall Risk   Falls in the past year? 0 1 1 0 0  Number falls in past yr: 0 0 0 0   Injury with Fall? 0 1 1 0   Risk for fall due to : No Fall Risks   Orthopedic patient   Follow up Falls prevention discussed   Falls evaluation completed;Education provided     FALL RISK PREVENTION PERTAINING TO  THE HOME:  Any stairs in or around the home? No  If so, are there any without handrails? No  Home free of loose throw rugs in walkways, pet beds, electrical cords, etc? Yes  Adequate lighting in your home to reduce risk of  falls? Yes   ASSISTIVE DEVICES UTILIZED TO PREVENT FALLS:  Life alert? No  Use of a cane, walker or w/c? No  Grab bars in the bathroom? Yes  Shower chair or bench in shower? Yes  Elevated toilet seat or a handicapped toilet? No   TIMED UP AND GO:  Was the test performed? No . Phone Visit  Cognitive Function:        04/02/2022   10:53 AM 09/28/2019    2:11 PM  6CIT Screen  What Year? 0 points 0 points  What month? 0 points 0 points  What time? 0 points 0 points  Count back from 20 0 points 0 points  Months in reverse 0 points 0 points  Repeat phrase 0 points 0 points  Total Score 0 points 0 points    Immunizations Immunization History  Administered Date(s) Administered   COVID-19, mRNA, vaccine(Comirnaty)12 years and older 12/26/2021   Fluad Quad(high Dose 65+) 11/25/2018, 12/22/2019   Hep A / Hep B 09/28/2018, 11/25/2018, 02/17/2019   Influenza,inj,Quad PF,6+ Mos 01/05/2018   Influenza-Unspecified 01/25/2015, 01/31/2016, 01/23/2021, 12/26/2021   PFIZER(Purple Top)SARS-COV-2 Vaccination 05/22/2019, 06/15/2019, 02/09/2020   Pneumococcal Conjugate-13 06/13/2016   Pneumococcal Polysaccharide-23 01/21/2018   Td 09/16/2013   Zoster Recombinat (Shingrix) 09/29/2019, 01/25/2020   Zoster, Live 09/22/2014    TDAP status: Up to date  Flu Vaccine status: Up to date  Pneumococcal vaccine status: Up to date  Covid-19 vaccine status: Completed vaccines  Qualifies for Shingles Vaccine? Yes   Zostavax completed Yes   Shingrix Completed?: Yes  Screening Tests Health Maintenance  Topic Date Due   HEMOGLOBIN A1C  10/21/2021   COVID-19 Vaccine (5 - 2023-24 season) 02/20/2022   Diabetic kidney evaluation - eGFR measurement  04/23/2022   Diabetic kidney evaluation - Urine ACR  04/23/2022   FOOT EXAM  04/23/2022   OPHTHALMOLOGY EXAM  06/21/2022   Medicare Annual Wellness (AWV)  04/03/2023   DTaP/Tdap/Td (2 - Tdap) 09/17/2023   COLONOSCOPY (Pts 45-5yr Insurance coverage  will need to be confirmed)  09/17/2023   Pneumonia Vaccine 72 Years old  Completed   INFLUENZA VACCINE  Completed   Hepatitis C Screening  Completed   Zoster Vaccines- Shingrix  Completed   HPV VACCINES  Aged Out    Health Maintenance  Health Maintenance Due  Topic Date Due   HEMOGLOBIN A1C  10/21/2021   COVID-19 Vaccine (5 - 2023-24 season) 02/20/2022   Diabetic kidney evaluation - eGFR measurement  04/23/2022   Diabetic kidney evaluation - Urine ACR  04/23/2022    Colorectal cancer screening: Type of screening: Colonoscopy. Completed 09/16/2013. Repeat every 10 years  Lung Cancer Screening: (Low Dose CT Chest recommended if Age 72-80years, 30 pack-year currently smoking OR have quit w/in 15years.) does not qualify.   Lung Cancer Screening Referral: no  Additional Screening:  Hepatitis C Screening: does qualify; Completed 06/13/2016  Vision Screening: Recommended annual ophthalmology exams for early detection of glaucoma and other disorders of the eye. Is the patient up to date with their annual eye exam?  Yes  Who is the provider or what is the name of the office in which the patient attends annual eye exams? [facility] If pt is not  established with a provider, would they like to be referred to a provider to establish care? No .   Dental Screening: Recommended annual dental exams for proper oral hygiene  Community Resource Referral / Chronic Care Management: CRR required this visit?  No   CCM required this visit?  No      Plan:     I have personally reviewed and noted the following in the patient's chart:   Medical and social history Use of alcohol, tobacco or illicit drugs  Current medications and supplements including opioid prescriptions. Patient is not currently taking opioid prescriptions. Functional ability and status Nutritional status Physical activity Advanced directives List of other physicians Hospitalizations, surgeries, and ER  visits in previous 12 months Vitals Screenings to include cognitive, depression, and falls Referrals and appointments  In addition, I have reviewed and discussed with patient certain preventive protocols, quality metrics, and best practice recommendations. A written personalized care plan for preventive services as well as general preventive health recommendations were provided to patient.     Sheral Flow, LPN   06/07/9371   Nurse Notes: N/A

## 2022-04-02 NOTE — Patient Instructions (Signed)
Devon Payne , Thank you for taking time to come for your Medicare Wellness Visit. I appreciate your ongoing commitment to your health goals. Please review the following plan we discussed and let me know if I can assist you in the future.   These are the goals we discussed:  Goals       Client understands the importance of follow-up with providers by attending scheduled visits. (pt-stated)      My goal for 2024 is to lose 50 pounds.        This is a list of the screening recommended for you and due dates:  Health Maintenance  Topic Date Due   Hemoglobin A1C  10/21/2021   COVID-19 Vaccine (5 - 2023-24 season) 02/20/2022   Yearly kidney function blood test for diabetes  04/23/2022   Yearly kidney health urinalysis for diabetes  04/23/2022   Complete foot exam   04/23/2022   Eye exam for diabetics  06/21/2022   Medicare Annual Wellness Visit  04/03/2023   DTaP/Tdap/Td vaccine (2 - Tdap) 09/17/2023   Colon Cancer Screening  09/17/2023   Pneumonia Vaccine  Completed   Flu Shot  Completed   Hepatitis C Screening: USPSTF Recommendation to screen - Ages 63-79 yo.  Completed   Zoster (Shingles) Vaccine  Completed   HPV Vaccine  Aged Out    Advanced directives: Yes; in the process of getting notarized.  Conditions/risks identified: Yes; Goal is to lose 50 pounds.  Next appointment: Follow up in one year for your annual wellness visit.   Preventive Care 51 Years and Older, Male  Preventive care refers to lifestyle choices and visits with your health care provider that can promote health and wellness. What does preventive care include? A yearly physical exam. This is also called an annual well check. Dental exams once or twice a year. Routine eye exams. Ask your health care provider how often you should have your eyes checked. Personal lifestyle choices, including: Daily care of your teeth and gums. Regular physical activity. Eating a healthy diet. Avoiding tobacco and drug  use. Limiting alcohol use. Practicing safe sex. Taking low doses of aspirin every day. Taking vitamin and mineral supplements as recommended by your health care provider. What happens during an annual well check? The services and screenings done by your health care provider during your annual well check will depend on your age, overall health, lifestyle risk factors, and family history of disease. Counseling  Your health care provider may ask you questions about your: Alcohol use. Tobacco use. Drug use. Emotional well-being. Home and relationship well-being. Sexual activity. Eating habits. History of falls. Memory and ability to understand (cognition). Work and work Statistician. Screening  You may have the following tests or measurements: Height, weight, and BMI. Blood pressure. Lipid and cholesterol levels. These may be checked every 5 years, or more frequently if you are over 60 years old. Skin check. Lung cancer screening. You may have this screening every year starting at age 37 if you have a 30-pack-year history of smoking and currently smoke or have quit within the past 15 years. Fecal occult blood test (FOBT) of the stool. You may have this test every year starting at age 31. Flexible sigmoidoscopy or colonoscopy. You may have a sigmoidoscopy every 5 years or a colonoscopy every 10 years starting at age 70. Prostate cancer screening. Recommendations will vary depending on your family history and other risks. Hepatitis C blood test. Hepatitis B blood test. Sexually transmitted disease (STD) testing.  Diabetes screening. This is done by checking your blood sugar (glucose) after you have not eaten for a while (fasting). You may have this done every 1-3 years. Abdominal aortic aneurysm (AAA) screening. You may need this if you are a current or former smoker. Osteoporosis. You may be screened starting at age 21 if you are at high risk. Talk with your health care provider about  your test results, treatment options, and if necessary, the need for more tests. Vaccines  Your health care provider may recommend certain vaccines, such as: Influenza vaccine. This is recommended every year. Tetanus, diphtheria, and acellular pertussis (Tdap, Td) vaccine. You may need a Td booster every 10 years. Zoster vaccine. You may need this after age 16. Pneumococcal 13-valent conjugate (PCV13) vaccine. One dose is recommended after age 37. Pneumococcal polysaccharide (PPSV23) vaccine. One dose is recommended after age 34. Talk to your health care provider about which screenings and vaccines you need and how often you need them. This information is not intended to replace advice given to you by your health care provider. Make sure you discuss any questions you have with your health care provider. Document Released: 04/14/2015 Document Revised: 12/06/2015 Document Reviewed: 01/17/2015 Elsevier Interactive Patient Education  2017 [place] Prevention in the Home Falls can cause injuries. They can happen to people of all ages. There are many things you can do to make your home safe and to help prevent falls. What can I do on the outside of my home? Regularly fix the edges of walkways and driveways and fix any cracks. Remove anything that might make you trip as you walk through a door, such as a raised step or threshold. Trim any bushes or trees on the path to your home. Use bright outdoor lighting. Clear any walking paths of anything that might make someone trip, such as rocks or tools. Regularly check to see if handrails are loose or broken. Make sure that both sides of any steps have handrails. Any raised decks and porches should have guardrails on the edges. Have any leaves, snow, or ice cleared regularly. Use sand or salt on walking paths during winter. Clean up any spills in your garage right away. This includes oil or grease spills. What can I do in the bathroom? Use  night lights. Install grab bars by the toilet and in the tub and shower. Do not use towel bars as grab bars. Use non-skid mats or decals in the tub or shower. If you need to sit down in the shower, use a plastic, non-slip stool. Keep the floor dry. Clean up any water that spills on the floor as soon as it happens. Remove soap buildup in the tub or shower regularly. Attach bath mats securely with double-sided non-slip rug tape. Do not have throw rugs and other things on the floor that can make you trip. What can I do in the bedroom? Use night lights. Make sure that you have a light by your bed that is easy to reach. Do not use any sheets or blankets that are too big for your bed. They should not hang down onto the floor. Have a firm chair that has side arms. You can use this for support while you get dressed. Do not have throw rugs and other things on the floor that can make you trip. What can I do in the kitchen? Clean up any spills right away. Avoid walking on wet floors. Keep items that you use a lot in easy-to-reach  places. If you need to reach something above you, use a strong step stool that has a grab bar. Keep electrical cords out of the way. Do not use floor polish or wax that makes floors slippery. If you must use wax, use non-skid floor wax. Do not have throw rugs and other things on the floor that can make you trip. What can I do with my stairs? Do not leave any items on the stairs. Make sure that there are handrails on both sides of the stairs and use them. Fix handrails that are broken or loose. Make sure that handrails are as long as the stairways. Check any carpeting to make sure that it is firmly attached to the stairs. Fix any carpet that is loose or worn. Avoid having throw rugs at the top or bottom of the stairs. If you do have throw rugs, attach them to the floor with carpet tape. Make sure that you have a light switch at the top of the stairs and the bottom of the  stairs. If you do not have them, ask someone to add them for you. What else can I do to help prevent falls? Wear shoes that: Do not have high heels. Have rubber bottoms. Are comfortable and fit you well. Are closed at the toe. Do not wear sandals. If you use a stepladder: Make sure that it is fully opened. Do not climb a closed stepladder. Make sure that both sides of the stepladder are locked into place. Ask someone to hold it for you, if possible. Clearly mark and make sure that you can see: Any grab bars or handrails. First and last steps. Where the edge of each step is. Use tools that help you move around (mobility aids) if they are needed. These include: Canes. Walkers. Scooters. Crutches. Turn on the lights when you go into a dark area. Replace any light bulbs as soon as they burn out. Set up your furniture so you have a clear path. Avoid moving your furniture around. If any of your floors are uneven, fix them. If there are any pets around you, be aware of where they are. Review your medicines with your doctor. Some medicines can make you feel dizzy. This can increase your chance of falling. Ask your doctor what other things that you can do to help prevent falls. This information is not intended to replace advice given to you by your health care provider. Make sure you discuss any questions you have with your health care provider. Document Released: 01/12/2009 Document Revised: 08/24/2015 Document Reviewed: 04/22/2014 Elsevier Interactive Patient Education  2017 [employer].

## 2022-04-04 ENCOUNTER — Ambulatory Visit (INDEPENDENT_AMBULATORY_CARE_PROVIDER_SITE_OTHER): Payer: PPO | Admitting: Internal Medicine

## 2022-04-04 ENCOUNTER — Ambulatory Visit (INDEPENDENT_AMBULATORY_CARE_PROVIDER_SITE_OTHER): Payer: PPO

## 2022-04-04 ENCOUNTER — Encounter: Payer: Self-pay | Admitting: Internal Medicine

## 2022-04-04 VITALS — BP 144/88 | HR 93 | Temp 98.0°F | Resp 16 | Ht 67.0 in | Wt 225.0 lb

## 2022-04-04 DIAGNOSIS — N182 Chronic kidney disease, stage 2 (mild): Secondary | ICD-10-CM | POA: Diagnosis not present

## 2022-04-04 DIAGNOSIS — E785 Hyperlipidemia, unspecified: Secondary | ICD-10-CM | POA: Diagnosis not present

## 2022-04-04 DIAGNOSIS — J0100 Acute maxillary sinusitis, unspecified: Secondary | ICD-10-CM | POA: Diagnosis not present

## 2022-04-04 DIAGNOSIS — I10 Essential (primary) hypertension: Secondary | ICD-10-CM

## 2022-04-04 DIAGNOSIS — R059 Cough, unspecified: Secondary | ICD-10-CM | POA: Diagnosis not present

## 2022-04-04 DIAGNOSIS — R052 Subacute cough: Secondary | ICD-10-CM

## 2022-04-04 DIAGNOSIS — R9431 Abnormal electrocardiogram [ECG] [EKG]: Secondary | ICD-10-CM | POA: Diagnosis not present

## 2022-04-04 DIAGNOSIS — I2510 Atherosclerotic heart disease of native coronary artery without angina pectoris: Secondary | ICD-10-CM

## 2022-04-04 DIAGNOSIS — Z0000 Encounter for general adult medical examination without abnormal findings: Secondary | ICD-10-CM

## 2022-04-04 DIAGNOSIS — K7581 Nonalcoholic steatohepatitis (NASH): Secondary | ICD-10-CM | POA: Diagnosis not present

## 2022-04-04 DIAGNOSIS — E118 Type 2 diabetes mellitus with unspecified complications: Secondary | ICD-10-CM

## 2022-04-04 DIAGNOSIS — N400 Enlarged prostate without lower urinary tract symptoms: Secondary | ICD-10-CM | POA: Diagnosis not present

## 2022-04-04 LAB — CBC WITH DIFFERENTIAL/PLATELET
Basophils Absolute: 0.1 10*3/uL (ref 0.0–0.1)
Basophils Relative: 0.4 % (ref 0.0–3.0)
Eosinophils Absolute: 0.1 10*3/uL (ref 0.0–0.7)
Eosinophils Relative: 0.7 % (ref 0.0–5.0)
HCT: 51.3 % (ref 39.0–52.0)
Hemoglobin: 17.1 g/dL — ABNORMAL HIGH (ref 13.0–17.0)
Lymphocytes Relative: 26.8 % (ref 12.0–46.0)
Lymphs Abs: 4.2 10*3/uL — ABNORMAL HIGH (ref 0.7–4.0)
MCHC: 33.3 g/dL (ref 30.0–36.0)
MCV: 86.2 fl (ref 78.0–100.0)
Monocytes Absolute: 1.5 10*3/uL — ABNORMAL HIGH (ref 0.1–1.0)
Monocytes Relative: 9.8 % (ref 3.0–12.0)
Neutro Abs: 9.7 10*3/uL — ABNORMAL HIGH (ref 1.4–7.7)
Neutrophils Relative %: 62.3 % (ref 43.0–77.0)
Platelets: 327 10*3/uL (ref 150.0–400.0)
RBC: 5.95 Mil/uL — ABNORMAL HIGH (ref 4.22–5.81)
RDW: 14.4 % (ref 11.5–15.5)
WBC: 15.6 10*3/uL — ABNORMAL HIGH (ref 4.0–10.5)

## 2022-04-04 LAB — LIPID PANEL
Cholesterol: 172 mg/dL (ref 0–200)
HDL: 53.3 mg/dL (ref >39.00)
NonHDL: 118.42
Total CHOL/HDL Ratio: 3
Triglycerides: 267 mg/dL — ABNORMAL HIGH (ref 0.0–149.0)
VLDL: 53.4 mg/dL — ABNORMAL HIGH (ref 0.0–40.0)

## 2022-04-04 LAB — MICROALBUMIN / CREATININE URINE RATIO
Creatinine,U: 67.1 mg/dL
Microalb Creat Ratio: 1.3 mg/g (ref 0.0–30.0)
Microalb, Ur: 0.9 mg/dL (ref 0.0–1.9)

## 2022-04-04 LAB — HEPATIC FUNCTION PANEL
ALT: 100 U/L — ABNORMAL HIGH (ref 0–53)
AST: 60 U/L — ABNORMAL HIGH (ref 0–37)
Albumin: 4.9 g/dL (ref 3.5–5.2)
Alkaline Phosphatase: 39 U/L (ref 39–117)
Bilirubin, Direct: 0.2 mg/dL (ref 0.0–0.3)
Total Bilirubin: 1 mg/dL (ref 0.2–1.2)
Total Protein: 7.7 g/dL (ref 6.0–8.3)

## 2022-04-04 LAB — URINALYSIS, ROUTINE W REFLEX MICROSCOPIC
Bilirubin Urine: NEGATIVE
Hgb urine dipstick: NEGATIVE
Ketones, ur: NEGATIVE
Leukocytes,Ua: NEGATIVE
Nitrite: NEGATIVE
RBC / HPF: NONE SEEN (ref 0–?)
Specific Gravity, Urine: 1.01 (ref 1.000–1.030)
Total Protein, Urine: NEGATIVE
Urine Glucose: NEGATIVE
Urobilinogen, UA: 0.2 (ref 0.0–1.0)
pH: 6 (ref 5.0–8.0)

## 2022-04-04 LAB — BASIC METABOLIC PANEL
BUN: 23 mg/dL (ref 6–23)
CO2: 27 mEq/L (ref 19–32)
Calcium: 10.3 mg/dL (ref 8.4–10.5)
Chloride: 95 mEq/L — ABNORMAL LOW (ref 96–112)
Creatinine, Ser: 1.14 mg/dL (ref 0.40–1.50)
GFR: 64.5 mL/min (ref >60.00)
Glucose, Bld: 130 mg/dL — ABNORMAL HIGH (ref 70–99)
Potassium: 4.5 mEq/L (ref 3.5–5.1)
Sodium: 138 mEq/L (ref 135–145)

## 2022-04-04 LAB — TSH: TSH: 3.01 u[IU]/mL (ref 0.35–5.50)

## 2022-04-04 LAB — HEMOGLOBIN A1C: Hgb A1c MFr Bld: 7.9 % — ABNORMAL HIGH (ref 4.6–6.5)

## 2022-04-04 LAB — PSA: PSA: 1.93 ng/mL (ref 0.10–4.00)

## 2022-04-04 LAB — LDL CHOLESTEROL, DIRECT: Direct LDL: 89 mg/dL

## 2022-04-04 MED ORDER — MOXIFLOXACIN HCL 400 MG PO TABS: 400.0000 mg | ORAL_TABLET | Freq: Every day | ORAL | 0 refills | Status: AC

## 2022-04-04 NOTE — Progress Notes (Signed)
Subjective:  Patient ID: Devon Payne, male    DOB: [birth year]  Age: 72 y.o. MRN: [id number]  CC: Diabetes, Hyperlipidemia, Hypertension, and URI   HPI FRANCISCO OSTROVSKY presents for f/up -  He complains of a 2-week history of URI symptoms with cough productive of clear phlegm, facial pain, runny nose, and postnasal drip.  His symptoms have improved with steroids but the sinus symptoms are worsening.  He is active and denies chest pain, shortness of breath, diaphoresis, or edema.  Outpatient Medications Prior to Visit  Medication Sig Dispense Refill   aspirin EC 81 MG tablet Take 1 tablet (81 mg total) by mouth daily. 90 tablet 3   benzonatate (TESSALON PERLES) 100 MG capsule Take 1 capsule (100 mg total) by mouth 3 (three) times daily as needed. 20 capsule 0   olmesartan (BENICAR) 20 MG tablet Take 1 tablet (20 mg total) by mouth daily. 30 tablet 0   pravastatin (PRAVACHOL) 20 MG tablet TAKE 1 TABLET BY MOUTH EACH NIGHT AT BEDTIME 90 tablet 0   indapamide (LOZOL) 1.25 MG tablet TAKE 1 TABLET BY MOUTH EVERY DAY 30 tablet 0   No facility-administered medications prior to visit.    ROS Review of Systems  Constitutional:  Negative for chills, diaphoresis, fatigue, fever and unexpected weight change.  HENT:  Positive for postnasal drip, rhinorrhea, sinus pressure and sinus pain. Negative for congestion, facial swelling, nosebleeds, sneezing, sore throat and trouble swallowing.   Eyes: Negative.   Respiratory:  Positive for cough. Negative for chest tightness and wheezing.   Cardiovascular:  Negative for chest pain, palpitations and leg swelling.  Gastrointestinal:  Negative for abdominal pain, constipation, diarrhea, nausea and vomiting.  Endocrine: Negative.   Genitourinary: Negative.  Negative for difficulty urinating.  Musculoskeletal: Negative.   Skin: Negative.  Negative for color change.  Neurological: Negative.  Negative for dizziness, weakness and headaches.  Hematological:   Negative for adenopathy. Does not bruise/bleed easily.  Psychiatric/Behavioral: Negative.      Objective:  BP (!) 144/88 (BP Location: Left Arm, Patient Position: Sitting, Cuff Size: Large)   Pulse 93   Temp 98 F (36.7 C) (Oral)   Resp 16   Ht '5\' 7"'$  (1.702 m)   Wt 225 lb (102.1 kg)   SpO2 98%   BMI 35.24 kg/m   BP Readings from Last 3 Encounters:  04/04/22 (!) 144/88  04/23/21 138/88  12/23/20 (!) 162/99    Wt Readings from Last 3 Encounters:  04/04/22 225 lb (102.1 kg)  04/02/22 225 lb (102.1 kg)  04/23/21 221 lb (100.2 kg)    Physical Exam Vitals reviewed.  Constitutional:      Appearance: Normal appearance. He is obese.  HENT:     Mouth/Throat:     Mouth: Mucous membranes are moist.  Eyes:     General: No scleral icterus.    Conjunctiva/sclera: Conjunctivae normal.  Cardiovascular:     Rate and Rhythm: Normal rate and regular rhythm.     Heart sounds: Normal heart sounds, S1 normal and S2 normal. No murmur heard.    Comments: EKG- NSR, 90 bpm ?LAE, LAD Inferior/lateral/anterior infarct pattern is old Pulmonary:     Effort: Pulmonary effort is normal.     Breath sounds: No stridor. No wheezing, rhonchi or rales.  Abdominal:     General: Abdomen is protuberant. Bowel sounds are normal. There is no distension.     Palpations: There is no hepatomegaly, splenomegaly or mass.     Tenderness:  There is no abdominal tenderness. There is no guarding or rebound.  Musculoskeletal:        General: Normal range of motion.     Cervical back: Neck supple.     Right lower leg: No edema.     Left lower leg: No edema.  Lymphadenopathy:     Cervical: No cervical adenopathy.  Skin:    General: Skin is warm and dry.  Neurological:     General: No focal deficit present.     Mental Status: He is alert. Mental status is at baseline.  Psychiatric:        Mood and Affect: Mood normal.        Behavior: Behavior normal.     Lab Results  Component Value Date   WBC 15.6  (H) 04/04/2022   HGB 17.1 (H) 04/04/2022   HCT 51.3 04/04/2022   PLT 327.0 04/04/2022   GLUCOSE 130 (H) 04/04/2022   CHOL 172 04/04/2022   TRIG 267.0 (H) 04/04/2022   HDL 53.30 04/04/2022   LDLDIRECT 89.0 04/04/2022   LDLCALC 110 (H) 04/23/2021   ALT 100 (H) 04/04/2022   AST 60 (H) 04/04/2022   NA 138 04/04/2022   K 4.5 04/04/2022   CL 95 (L) 04/04/2022   CREATININE 1.14 04/04/2022   BUN 23 04/04/2022   CO2 27 04/04/2022   TSH 3.01 04/04/2022   PSA 1.93 04/04/2022   INR 1.0 04/23/2021   HGBA1C 7.9 (H) 04/04/2022   MICROALBUR 0.9 04/04/2022    DG Wrist Complete Left  Result Date: 02/10/2021 CLINICAL DATA:  Distal radial fracture EXAM: LEFT WRIST - COMPLETE 3+ VIEW COMPARISON:  01/23/2021 FINDINGS: Frontal, oblique, lateral, and ulnar deviated views of the left wrist are obtained. There is a comminuted impacted intra-articular distal left radial fracture in stable position since prior exam, with dorsal angulation at the fracture site. Radiocarpal joint remains intact. No other acute bony abnormalities. 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 Marked soft tissue swelling greatest along the dorsum of the wrist. IMPRESSION: 1. Stable comminuted intra-articular distal left radial fracture, with continue dorsal impaction at the fracture site. 2. Stable degenerative changes within the radial aspect of the carpus. Electronically Signed   By: Randa Ngo M.D.   On: 02/10/2021 15:06   DG Chest 2 View  Result Date: 04/04/2022 CLINICAL DATA:  Cough. EXAM: CHEST - 2 VIEW COMPARISON:  April 19, 2011. FINDINGS: The heart size and mediastinal contours are within normal limits. Both lungs are clear. The visualized skeletal structures are unremarkable. IMPRESSION: No active cardiopulmonary disease. Electronically Signed   By: Marijo Conception M.D.   On: 04/04/2022 14:45     Assessment & Plan:   Max was seen today for diabetes, hyperlipidemia, hypertension and uri.  Diagnoses and  all orders for this visit:  Nonalcoholic steatohepatitis (NASH)- I have asked him to continue working on his lifestyle modifications.  The GLP/GIP agonist may help with this. -     Hepatic function panel; Future -     Hepatic function panel -     tirzepatide (MOUNJARO) 2.5 MG/0.5ML Pen; Inject 2.5 mg into the skin once a week.  Essential hypertension- His blood pressure is not at goal.  I recommended that he improve his lifestyle modifications and continue the current antihypertensives. -     Basic metabolic panel; Future -     CBC with Differential/Platelet; Future -     TSH; Future -     Urinalysis, Routine w reflex microscopic;  Future -     EKG 12-Lead -     Urinalysis, Routine w reflex microscopic -     TSH -     CBC with Differential/Platelet -     Basic metabolic panel -     indapamide (LOZOL) 1.25 MG tablet; Take 1 tablet (1.25 mg total) by mouth daily.  Type II diabetes mellitus with manifestations ([place])- His blood sugar is not adequately well-controlled.  Will start an SGLT2 inhibitor and a GLP/GIP agonist. -     Microalbumin / creatinine urine ratio; Future -     Hemoglobin A1c; Future -     Hemoglobin A1c -     Microalbumin / creatinine urine ratio -     HM Diabetes Foot Exam -     dapagliflozin propanediol (FARXIGA) 10 MG TABS tablet; Take 1 tablet (10 mg total) by mouth daily before breakfast. -     tirzepatide (MOUNJARO) 2.5 MG/0.5ML Pen; Inject 2.5 mg into the skin once a week.  Benign prostatic hyperplasia without lower urinary tract symptoms- His PSA is normal. -     PSA; Future -     PSA  Hyperlipidemia with target LDL less than 100- LDL goal achieved. Doing well on the statin  -     Lipid panel; Future -     TSH; Future -     Hepatic function panel; Future -     Hepatic function panel -     TSH -     Lipid panel  Routine general medical examination at a health care facility  Subacute cough- Chest x-ray is negative for mass or infiltrate. -     DG Chest 2  View; Future  Abnormal electrocardiogram (ECG) (EKG) -     CT CORONARY MORPH W/CTA COR W/SCORE W/CA W/CM &/OR WO/CM; Future  Abnormal electrocardiogram -     CT CORONARY MORPH W/CTA COR W/SCORE W/CA W/CM &/OR WO/CM; Future  Acute non-recurrent maxillary sinusitis -     moxifloxacin (AVELOX) 400 MG tablet; Take 1 tablet (400 mg total) by mouth daily for 10 days.  Chronic renal disease, stage 2, mildly decreased glomerular filtration rate (GFR) between 60-89 mL/min/1.73 square meter -     dapagliflozin propanediol (FARXIGA) 10 MG TABS tablet; Take 1 tablet (10 mg total) by mouth daily before breakfast.  Other orders -     LDL cholesterol, direct   I have changed Davanta L. Dickison's indapamide. I am also having him start on moxifloxacin, dapagliflozin propanediol, and tirzepatide. Additionally, I am having him maintain his aspirin EC, olmesartan, pravastatin, and benzonatate.  Meds ordered this encounter  Medications   moxifloxacin (AVELOX) 400 MG tablet    Sig: Take 1 tablet (400 mg total) by mouth daily for 10 days.    Dispense:  10 tablet    Refill:  0   dapagliflozin propanediol (FARXIGA) 10 MG TABS tablet    Sig: Take 1 tablet (10 mg total) by mouth daily before breakfast.    Dispense:  90 tablet    Refill:  1   tirzepatide (MOUNJARO) 2.5 MG/0.5ML Pen    Sig: Inject 2.5 mg into the skin once a week.    Dispense:  2 mL    Refill:  0   indapamide (LOZOL) 1.25 MG tablet    Sig: Take 1 tablet (1.25 mg total) by mouth daily.    Dispense:  90 tablet    Refill:  0     Follow-up: No follow-ups on file.  Marcello Moores  Ronnald Ramp, MD

## 2022-04-05 ENCOUNTER — Telehealth: Payer: Self-pay

## 2022-04-05 ENCOUNTER — Other Ambulatory Visit: Payer: Self-pay | Admitting: Internal Medicine

## 2022-04-05 DIAGNOSIS — I10 Essential (primary) hypertension: Secondary | ICD-10-CM

## 2022-04-05 DIAGNOSIS — J0100 Acute maxillary sinusitis, unspecified: Secondary | ICD-10-CM | POA: Insufficient documentation

## 2022-04-05 DIAGNOSIS — N182 Chronic kidney disease, stage 2 (mild): Secondary | ICD-10-CM | POA: Insufficient documentation

## 2022-04-05 MED ORDER — DAPAGLIFLOZIN PROPANEDIOL 10 MG PO TABS: 10.0000 mg | ORAL_TABLET | Freq: Every day | ORAL | 1 refills | Status: DC

## 2022-04-05 MED ORDER — INDAPAMIDE 1.25 MG PO TABS: 1.2500 mg | ORAL_TABLET | Freq: Every day | ORAL | 0 refills | Status: DC

## 2022-04-05 MED ORDER — TIRZEPATIDE 2.5 MG/0.5ML ~~LOC~~ SOAJ: 2.5000 mg | SUBCUTANEOUS | 0 refills | Status: DC

## 2022-04-05 NOTE — Telephone Encounter (Signed)
Approved 04/05/22 - 04/06/23

## 2022-04-05 NOTE — Telephone Encounter (Signed)
Key: [id number]

## 2022-04-22 ENCOUNTER — Other Ambulatory Visit: Payer: Self-pay | Admitting: Internal Medicine

## 2022-04-22 DIAGNOSIS — I10 Essential (primary) hypertension: Secondary | ICD-10-CM

## 2022-04-22 DIAGNOSIS — E118 Type 2 diabetes mellitus with unspecified complications: Secondary | ICD-10-CM

## 2022-04-23 ENCOUNTER — Encounter (HOSPITAL_COMMUNITY): Payer: Self-pay

## 2022-04-23 ENCOUNTER — Other Ambulatory Visit (HOSPITAL_COMMUNITY): Payer: Self-pay | Admitting: *Deleted

## 2022-04-23 MED ORDER — METOPROLOL TARTRATE 100 MG PO TABS: ORAL_TABLET | ORAL | 0 refills | Status: DC

## 2022-04-24 ENCOUNTER — Telehealth (HOSPITAL_COMMUNITY): Payer: Self-pay | Admitting: *Deleted

## 2022-04-24 NOTE — Telephone Encounter (Signed)
Attempted to call patient regarding upcoming cardiac CT appointment. °Left message on voicemail with name and callback number ° °Keyanni Whittinghill RN Navigator Cardiac Imaging °[facility] °[phone] Office °[phone] Cell ° °

## 2022-04-25 ENCOUNTER — Ambulatory Visit (HOSPITAL_COMMUNITY)
Admission: RE | Admit: 2022-04-25 | Discharge: 2022-04-25 | Disposition: A | Discharge status: Home / Self Care | Payer: PPO | Source: Ambulatory Visit | Attending: Internal Medicine | Admitting: Internal Medicine

## 2022-04-25 ENCOUNTER — Other Ambulatory Visit: Payer: Self-pay | Admitting: Cardiovascular Disease

## 2022-04-25 DIAGNOSIS — R9431 Abnormal electrocardiogram [ECG] [EKG]: Secondary | ICD-10-CM | POA: Diagnosis not present

## 2022-04-25 DIAGNOSIS — E785 Hyperlipidemia, unspecified: Secondary | ICD-10-CM | POA: Insufficient documentation

## 2022-04-25 DIAGNOSIS — E119 Type 2 diabetes mellitus without complications: Secondary | ICD-10-CM | POA: Insufficient documentation

## 2022-04-25 DIAGNOSIS — I700 Atherosclerosis of aorta: Secondary | ICD-10-CM | POA: Insufficient documentation

## 2022-04-25 DIAGNOSIS — R931 Abnormal findings on diagnostic imaging of heart and coronary circulation: Secondary | ICD-10-CM | POA: Insufficient documentation

## 2022-04-25 DIAGNOSIS — I2510 Atherosclerotic heart disease of native coronary artery without angina pectoris: Secondary | ICD-10-CM

## 2022-04-25 DIAGNOSIS — I10 Essential (primary) hypertension: Secondary | ICD-10-CM | POA: Insufficient documentation

## 2022-04-25 MED ORDER — NITROGLYCERIN 0.4 MG SL SUBL: 0.8000 mg | SUBLINGUAL_TABLET | Freq: Once | SUBLINGUAL | Status: AC

## 2022-04-25 MED ORDER — METOPROLOL TARTRATE 5 MG/5ML IV SOLN
INTRAVENOUS | Status: AC
  Administered 2022-04-25: 5 mg via INTRAVENOUS
  Filled 2022-04-25: qty 10

## 2022-04-25 MED ORDER — METOPROLOL TARTRATE 5 MG/5ML IV SOLN
10.0000 mg | INTRAVENOUS | Status: DC | PRN
  Administered 2022-04-25: 5 mg via INTRAVENOUS

## 2022-04-25 MED ORDER — METOPROLOL TARTRATE 5 MG/5ML IV SOLN: 5.0000 mg | INTRAVENOUS | Status: DC | PRN

## 2022-04-25 MED ORDER — IOHEXOL 350 MG/ML SOLN
100.0000 mL | Freq: Once | INTRAVENOUS | Status: AC | PRN
  Administered 2022-04-25: 100 mL via INTRAVENOUS

## 2022-04-25 MED ORDER — NITROGLYCERIN 0.4 MG SL SUBL
SUBLINGUAL_TABLET | SUBLINGUAL | Status: AC
  Administered 2022-04-25: 0.8 mg via SUBLINGUAL
  Filled 2022-04-25: qty 2

## 2022-04-26 ENCOUNTER — Ambulatory Visit (HOSPITAL_BASED_OUTPATIENT_CLINIC_OR_DEPARTMENT_OTHER)
Admission: RE | Admit: 2022-04-26 | Discharge: 2022-04-26 | Disposition: A | Discharge status: Home / Self Care | Payer: PPO | Source: Ambulatory Visit | Attending: Cardiovascular Disease | Admitting: Cardiovascular Disease

## 2022-04-26 DIAGNOSIS — I2510 Atherosclerotic heart disease of native coronary artery without angina pectoris: Secondary | ICD-10-CM | POA: Diagnosis not present

## 2022-05-06 ENCOUNTER — Encounter: Payer: Self-pay | Admitting: Internal Medicine

## 2022-05-10 ENCOUNTER — Other Ambulatory Visit: Payer: Self-pay | Admitting: Internal Medicine

## 2022-05-10 DIAGNOSIS — E118 Type 2 diabetes mellitus with unspecified complications: Secondary | ICD-10-CM

## 2022-05-10 DIAGNOSIS — K7581 Nonalcoholic steatohepatitis (NASH): Secondary | ICD-10-CM

## 2022-05-10 MED ORDER — TIRZEPATIDE 5 MG/0.5ML ~~LOC~~ SOAJ: 5.0000 mg | SUBCUTANEOUS | 0 refills | Status: DC

## 2022-05-16 ENCOUNTER — Other Ambulatory Visit: Payer: Self-pay | Admitting: Internal Medicine

## 2022-05-20 ENCOUNTER — Other Ambulatory Visit: Payer: Self-pay | Admitting: Internal Medicine

## 2022-05-20 DIAGNOSIS — I2510 Atherosclerotic heart disease of native coronary artery without angina pectoris: Secondary | ICD-10-CM

## 2022-05-31 ENCOUNTER — Encounter: Payer: Self-pay | Admitting: Internal Medicine

## 2022-06-03 NOTE — Addendum Note (Signed)
Addended by: Janith Lima on: 06/03/2022 12:16 PM   Modules accepted: Orders

## 2022-06-05 ENCOUNTER — Encounter: Payer: Self-pay | Admitting: Internal Medicine

## 2022-06-05 DIAGNOSIS — E785 Hyperlipidemia, unspecified: Secondary | ICD-10-CM

## 2022-06-05 MED ORDER — PRAVASTATIN SODIUM 20 MG PO TABS: ORAL_TABLET | ORAL | 1 refills | Status: AC

## 2022-06-10 ENCOUNTER — Other Ambulatory Visit: Payer: Self-pay | Admitting: Internal Medicine

## 2022-06-10 DIAGNOSIS — E118 Type 2 diabetes mellitus with unspecified complications: Secondary | ICD-10-CM

## 2022-06-11 ENCOUNTER — Encounter: Payer: Self-pay | Admitting: Internal Medicine

## 2022-06-11 ENCOUNTER — Other Ambulatory Visit: Payer: Self-pay | Admitting: Internal Medicine

## 2022-06-11 DIAGNOSIS — E118 Type 2 diabetes mellitus with unspecified complications: Secondary | ICD-10-CM

## 2022-06-13 ENCOUNTER — Encounter: Payer: Self-pay | Admitting: Internal Medicine

## 2022-07-03 ENCOUNTER — Other Ambulatory Visit: Payer: Self-pay | Admitting: Internal Medicine

## 2022-07-03 DIAGNOSIS — E118 Type 2 diabetes mellitus with unspecified complications: Secondary | ICD-10-CM

## 2022-07-08 ENCOUNTER — Encounter: Payer: Self-pay | Admitting: Internal Medicine

## 2022-07-11 ENCOUNTER — Ambulatory Visit (INDEPENDENT_AMBULATORY_CARE_PROVIDER_SITE_OTHER): Payer: PPO | Admitting: Internal Medicine

## 2022-07-11 ENCOUNTER — Encounter: Payer: Self-pay | Admitting: Internal Medicine

## 2022-07-11 VITALS — BP 118/72 | HR 90 | Temp 98.2°F | Ht 67.0 in | Wt 195.0 lb

## 2022-07-11 DIAGNOSIS — E785 Hyperlipidemia, unspecified: Secondary | ICD-10-CM | POA: Diagnosis not present

## 2022-07-11 DIAGNOSIS — E118 Type 2 diabetes mellitus with unspecified complications: Secondary | ICD-10-CM

## 2022-07-11 DIAGNOSIS — I10 Essential (primary) hypertension: Secondary | ICD-10-CM

## 2022-07-11 DIAGNOSIS — K7581 Nonalcoholic steatohepatitis (NASH): Secondary | ICD-10-CM

## 2022-07-11 DIAGNOSIS — I2510 Atherosclerotic heart disease of native coronary artery without angina pectoris: Secondary | ICD-10-CM | POA: Diagnosis not present

## 2022-07-11 LAB — CBC WITH DIFFERENTIAL/PLATELET
Basophils Absolute: 0.1 10*3/uL (ref 0.0–0.1)
Basophils Relative: 1 % (ref 0.0–3.0)
Eosinophils Absolute: 0.1 10*3/uL (ref 0.0–0.7)
Eosinophils Relative: 1.1 % (ref 0.0–5.0)
HCT: 47.6 % (ref 39.0–52.0)
Hemoglobin: 16.1 g/dL (ref 13.0–17.0)
Lymphocytes Relative: 27.5 % (ref 12.0–46.0)
Lymphs Abs: 1.6 10*3/uL (ref 0.7–4.0)
MCHC: 33.8 g/dL (ref 30.0–36.0)
MCV: 85.7 fl (ref 78.0–100.0)
Monocytes Absolute: 0.7 10*3/uL (ref 0.1–1.0)
Monocytes Relative: 11.2 % (ref 3.0–12.0)
Neutro Abs: 3.5 10*3/uL (ref 1.4–7.7)
Neutrophils Relative %: 59.2 % (ref 43.0–77.0)
Platelets: 255 10*3/uL (ref 150.0–400.0)
RBC: 5.55 Mil/uL (ref 4.22–5.81)
RDW: 14.6 % (ref 11.5–15.5)
WBC: 5.9 10*3/uL (ref 4.0–10.5)

## 2022-07-11 LAB — HEPATIC FUNCTION PANEL
ALT: 27 U/L (ref 0–53)
AST: 30 U/L (ref 0–37)
Albumin: 4.6 g/dL (ref 3.5–5.2)
Alkaline Phosphatase: 38 U/L — ABNORMAL LOW (ref 39–117)
Bilirubin, Direct: 0.2 mg/dL (ref 0.0–0.3)
Total Bilirubin: 0.8 mg/dL (ref 0.2–1.2)
Total Protein: 7.2 g/dL (ref 6.0–8.3)

## 2022-07-11 LAB — BASIC METABOLIC PANEL
BUN: 17 mg/dL (ref 6–23)
CO2: 26 mEq/L (ref 19–32)
Calcium: 9.6 mg/dL (ref 8.4–10.5)
Chloride: 104 mEq/L (ref 96–112)
Creatinine, Ser: 1.06 mg/dL (ref 0.40–1.50)
GFR: 70.26 mL/min (ref >60.00)
Glucose, Bld: 96 mg/dL (ref 70–99)
Potassium: 4.9 mEq/L (ref 3.5–5.1)
Sodium: 139 mEq/L (ref 135–145)

## 2022-07-11 LAB — PROTIME-INR
INR: 1.1 ratio — ABNORMAL HIGH (ref 0.8–1.0)
Prothrombin Time: 12.1 s (ref 9.6–13.1)

## 2022-07-11 LAB — HEMOGLOBIN A1C: Hgb A1c MFr Bld: 6 % (ref 4.6–6.5)

## 2022-07-11 MED ORDER — ASPIRIN 81 MG PO TBEC: 81.0000 mg | DELAYED_RELEASE_TABLET | Freq: Every day | ORAL | 1 refills | Status: AC

## 2022-07-11 NOTE — Patient Instructions (Signed)

## 2022-07-11 NOTE — Progress Notes (Signed)
Subjective:  Patient ID: Devon Payne, male    DOB: 08-26-1950  Age: 72 y.o. MRN: [id number]  CC: Hypertension and Diabetes   HPI Devon Payne presents for f/up --  He is active and denies chest pain, shortness of breath, diaphoresis, or edema.  He is tolerating the current dose of Mounjaro with the exception of an occasional episode of constipation.  He denies abdominal pain, nausea, vomiting, or diarrhea.  Outpatient Medications Prior to Visit  Medication Sig Dispense Refill   MOUNJARO 5 MG/0.5ML Pen INJECT  INTO THE SKIN ONCE A WEEK 2 mL 0   olmesartan (BENICAR) 20 MG tablet TAKE 1 TABLET BY MOUTH EVERY DAY 90 tablet 0   pravastatin (PRAVACHOL) 20 MG tablet TAKE 1 TABLET BY MOUTH EACH NIGHT AT BEDTIME 90 tablet 1   aspirin EC 81 MG tablet Take 1 tablet (81 mg total) by mouth daily. 90 tablet 3   dapagliflozin propanediol (FARXIGA) 10 MG TABS tablet Take 1 tablet (10 mg total) by mouth daily before breakfast. 90 tablet 1   No facility-administered medications prior to visit.    ROS Review of Systems  Constitutional: Negative.  Negative for chills, diaphoresis, fatigue and fever.  HENT: Negative.    Eyes: Negative.   Respiratory: Negative.  Negative for cough, chest tightness, shortness of breath and wheezing.   Cardiovascular:  Negative for chest pain, palpitations and leg swelling.  Gastrointestinal:  Negative for abdominal pain, constipation, diarrhea, nausea and vomiting.  Endocrine: Negative.   Genitourinary: Negative.  Negative for difficulty urinating.  Musculoskeletal: Negative.  Negative for arthralgias and myalgias.  Skin: Negative.   Neurological:  Negative for dizziness, weakness, light-headedness and headaches.  Hematological:  Negative for adenopathy. Does not bruise/bleed easily.  Psychiatric/Behavioral: Negative.      Objective:  BP 118/72 (BP Location: Right Arm, Patient Position: Sitting, Cuff Size: Large)   Pulse 90   Temp 98.2 F (36.8 C)  (Oral)   Ht  (1.702 m)   Wt 195 lb (88.5 kg)   SpO2 97%   BMI 30.54 kg/m   BP Readings from Last 3 Encounters:  07/11/22 118/72  04/25/22 110/71  04/04/22 (!) 144/88    Wt Readings from Last 3 Encounters:  07/11/22 195 lb (88.5 kg)  04/04/22 225 lb (102.1 kg)  04/02/22 225 lb (102.1 kg)    Physical Exam Vitals reviewed.  Constitutional:      Appearance: Normal appearance.  HENT:     Nose: Nose normal.     Mouth/Throat:     Mouth: Mucous membranes are moist.  Eyes:     General: No scleral icterus.    Conjunctiva/sclera: Conjunctivae normal.  Cardiovascular:     Rate and Rhythm: Normal rate.     Heart sounds: No murmur heard. Pulmonary:     Effort: Pulmonary effort is normal.     Breath sounds: No stridor. No wheezing, rhonchi or rales.  Abdominal:     General: Abdomen is flat.     Palpations: There is no mass.     Tenderness: There is no abdominal tenderness. There is no guarding.     Hernia: No hernia is present.  Musculoskeletal:     Cervical back: Neck supple.  Lymphadenopathy:     Cervical: No cervical adenopathy.  Skin:    General: Skin is warm and dry.  Neurological:     General: No focal deficit present.     Mental Status: He is alert. Mental status is at baseline.  Psychiatric:        Mood and Affect: Mood normal.        Behavior: Behavior normal.     Lab Results  Component Value Date   WBC 5.9 07/11/2022   HGB 16.1 07/11/2022   HCT 47.6 07/11/2022   PLT 255.0 07/11/2022   GLUCOSE 96 07/11/2022   CHOL 172 04/04/2022   TRIG 267.0 (H) 04/04/2022   HDL 53.30 04/04/2022   LDLDIRECT 89.0 04/04/2022   LDLCALC 110 (H) 04/23/2021   ALT 27 07/11/2022   AST 30 07/11/2022   NA 139 07/11/2022   K 4.9 07/11/2022   CL 104 07/11/2022   CREATININE 1.06 07/11/2022   BUN 17 07/11/2022   CO2 26 07/11/2022   TSH 3.01 04/04/2022   PSA 1.93 04/04/2022   INR 1.1 (H) 07/11/2022   HGBA1C 6.0 07/11/2022   MICROALBUR 0.9 04/04/2022    No results  found.  Assessment & Plan:   Essential hypertension - His blood sugar is very well-controlled. -     Basic metabolic panel; Future -     CBC with Differential/Platelet; Future  Type II diabetes mellitus with manifestations- His A1c is down to 6.0%.  Will discontinue the SGLT2 inhibitor but continue the current dose of Mounjaro. -     Basic metabolic panel; Future -     Hemoglobin A1c; Future -     Ambulatory referral to Ophthalmology  Hyperlipidemia with target LDL less than 100 - LDL goal achieved. Doing well on the statin  -     Hepatic function panel; Future  Nonalcoholic steatohepatitis (NASH)- His liver enzymes are normal now. -     Hepatic function panel; Future -     Protime-INR; Future  Coronary artery disease involving native coronary artery of native heart without angina pectoris -     Aspirin; Take 1 tablet (81 mg total) by mouth daily. Swallow whole.  Dispense: 90 tablet; Refill: 1     Follow-up: Return in about 6 months (around 01/10/2023).  Sanda Linger, MD

## 2022-07-15 ENCOUNTER — Encounter: Payer: Self-pay | Admitting: Internal Medicine

## 2022-07-15 ENCOUNTER — Other Ambulatory Visit: Payer: Self-pay | Admitting: Internal Medicine

## 2022-07-15 DIAGNOSIS — E118 Type 2 diabetes mellitus with unspecified complications: Secondary | ICD-10-CM

## 2022-07-16 ENCOUNTER — Other Ambulatory Visit: Payer: Self-pay | Admitting: Internal Medicine

## 2022-07-16 DIAGNOSIS — E118 Type 2 diabetes mellitus with unspecified complications: Secondary | ICD-10-CM

## 2022-07-16 MED ORDER — EMPAGLIFLOZIN 25 MG PO TABS: 25.0000 mg | ORAL_TABLET | Freq: Every day | ORAL | 1 refills | Status: AC

## 2022-08-06 ENCOUNTER — Other Ambulatory Visit: Payer: Self-pay | Admitting: Internal Medicine

## 2022-08-06 DIAGNOSIS — I10 Essential (primary) hypertension: Secondary | ICD-10-CM

## 2022-08-19 ENCOUNTER — Other Ambulatory Visit: Payer: Self-pay | Admitting: Internal Medicine

## 2022-08-19 DIAGNOSIS — I10 Essential (primary) hypertension: Secondary | ICD-10-CM

## 2022-08-19 DIAGNOSIS — E118 Type 2 diabetes mellitus with unspecified complications: Secondary | ICD-10-CM

## 2022-08-27 DIAGNOSIS — E7800 Pure hypercholesterolemia, unspecified: Secondary | ICD-10-CM | POA: Diagnosis not present

## 2022-08-27 DIAGNOSIS — M5136 Other intervertebral disc degeneration, lumbar region: Secondary | ICD-10-CM | POA: Diagnosis not present

## 2022-08-27 DIAGNOSIS — I10 Essential (primary) hypertension: Secondary | ICD-10-CM | POA: Diagnosis not present

## 2022-08-27 DIAGNOSIS — E119 Type 2 diabetes mellitus without complications: Secondary | ICD-10-CM | POA: Diagnosis not present

## 2022-08-27 DIAGNOSIS — Z7689 Persons encountering health services in other specified circumstances: Secondary | ICD-10-CM | POA: Diagnosis not present

## 2022-09-27 DIAGNOSIS — E7800 Pure hypercholesterolemia, unspecified: Secondary | ICD-10-CM | POA: Diagnosis not present

## 2022-09-27 DIAGNOSIS — I2510 Atherosclerotic heart disease of native coronary artery without angina pectoris: Secondary | ICD-10-CM | POA: Diagnosis not present

## 2022-09-27 DIAGNOSIS — E119 Type 2 diabetes mellitus without complications: Secondary | ICD-10-CM | POA: Diagnosis not present

## 2022-09-27 DIAGNOSIS — R9431 Abnormal electrocardiogram [ECG] [EKG]: Secondary | ICD-10-CM | POA: Diagnosis not present

## 2022-09-27 DIAGNOSIS — I10 Essential (primary) hypertension: Secondary | ICD-10-CM | POA: Diagnosis not present

## 2022-10-01 DIAGNOSIS — I499 Cardiac arrhythmia, unspecified: Secondary | ICD-10-CM | POA: Diagnosis not present

## 2022-10-09 DIAGNOSIS — R9431 Abnormal electrocardiogram [ECG] [EKG]: Secondary | ICD-10-CM | POA: Diagnosis not present

## 2022-10-10 ENCOUNTER — Ambulatory Visit: Payer: PPO | Admitting: Internal Medicine

## 2022-10-21 ENCOUNTER — Telehealth: Payer: Self-pay | Admitting: *Deleted

## 2022-10-21 NOTE — Telephone Encounter (Signed)
I attempted to contact patient by telephone but was unsuccessful. According to the patient's chart they are due for follow up  with [facility]. I have left a HIPAA compliant message advising the patient to contact [facility] at [phone]. I will continue to follow up with the patient to make sure this appointment is scheduled.

## 2022-11-28 DIAGNOSIS — Z7985 Long-term (current) use of injectable non-insulin antidiabetic drugs: Secondary | ICD-10-CM | POA: Diagnosis not present

## 2022-11-28 DIAGNOSIS — E119 Type 2 diabetes mellitus without complications: Secondary | ICD-10-CM | POA: Diagnosis not present

## 2022-11-28 DIAGNOSIS — Z79899 Other long term (current) drug therapy: Secondary | ICD-10-CM | POA: Diagnosis not present

## 2022-11-28 DIAGNOSIS — I10 Essential (primary) hypertension: Secondary | ICD-10-CM | POA: Diagnosis not present

## 2022-11-28 DIAGNOSIS — E7800 Pure hypercholesterolemia, unspecified: Secondary | ICD-10-CM | POA: Diagnosis not present

## 2022-12-17 DIAGNOSIS — E1165 Type 2 diabetes mellitus with hyperglycemia: Secondary | ICD-10-CM | POA: Diagnosis not present

## 2022-12-17 DIAGNOSIS — H40053 Ocular hypertension, bilateral: Secondary | ICD-10-CM | POA: Diagnosis not present

## 2022-12-17 DIAGNOSIS — D3131 Benign neoplasm of right choroid: Secondary | ICD-10-CM | POA: Diagnosis not present

## 2023-03-12 DIAGNOSIS — Z0000 Encounter for general adult medical examination without abnormal findings: Secondary | ICD-10-CM | POA: Diagnosis not present

## 2023-03-12 DIAGNOSIS — Z7985 Long-term (current) use of injectable non-insulin antidiabetic drugs: Secondary | ICD-10-CM | POA: Diagnosis not present

## 2023-03-12 DIAGNOSIS — Z79899 Other long term (current) drug therapy: Secondary | ICD-10-CM | POA: Diagnosis not present

## 2023-03-12 DIAGNOSIS — R634 Abnormal weight loss: Secondary | ICD-10-CM | POA: Diagnosis not present

## 2023-03-12 DIAGNOSIS — I10 Essential (primary) hypertension: Secondary | ICD-10-CM | POA: Diagnosis not present

## 2023-03-12 DIAGNOSIS — E7800 Pure hypercholesterolemia, unspecified: Secondary | ICD-10-CM | POA: Diagnosis not present

## 2023-03-12 DIAGNOSIS — E119 Type 2 diabetes mellitus without complications: Secondary | ICD-10-CM | POA: Diagnosis not present
